# Patient Record
Sex: Female | Born: 1950 | Race: White | Hispanic: No | Marital: Married | Smoking: Never smoker
Health system: Southern US, Community
[De-identification: ages and names within clinical notes are randomized; demographics above are authoritative.]

## PROBLEM LIST (undated history)

## (undated) DIAGNOSIS — K219 Gastro-esophageal reflux disease without esophagitis: Secondary | ICD-10-CM

## (undated) DIAGNOSIS — T7840XA Allergy, unspecified, initial encounter: Secondary | ICD-10-CM

## (undated) DIAGNOSIS — M25569 Pain in unspecified knee: Secondary | ICD-10-CM

## (undated) DIAGNOSIS — M1990 Unspecified osteoarthritis, unspecified site: Secondary | ICD-10-CM

## (undated) DIAGNOSIS — B019 Varicella without complication: Secondary | ICD-10-CM

## (undated) DIAGNOSIS — E559 Vitamin D deficiency, unspecified: Secondary | ICD-10-CM

## (undated) DIAGNOSIS — J302 Other seasonal allergic rhinitis: Secondary | ICD-10-CM

## (undated) HISTORY — DX: Gastro-esophageal reflux disease without esophagitis: K21.9

## (undated) HISTORY — DX: Vitamin D deficiency, unspecified: E55.9

## (undated) HISTORY — PX: OTHER SURGICAL HISTORY: SHX169

## (undated) HISTORY — PX: UPPER GASTROINTESTINAL ENDOSCOPY: SHX188

## (undated) HISTORY — PX: POLYPECTOMY: SHX149

## (undated) HISTORY — PX: COLONOSCOPY: SHX174

## (undated) HISTORY — DX: Pain in unspecified knee: M25.569

## (undated) HISTORY — DX: Varicella without complication: B01.9

## (undated) HISTORY — DX: Allergy, unspecified, initial encounter: T78.40XA

## (undated) HISTORY — DX: Other seasonal allergic rhinitis: J30.2

## (undated) HISTORY — PX: KNEE SURGERY: SHX244

---

## 2000-07-15 ENCOUNTER — Other Ambulatory Visit: Admission: RE | Admit: 2000-07-15 | Discharge: 2000-07-15 | Payer: Self-pay | Admitting: Obstetrics and Gynecology

## 2000-12-28 ENCOUNTER — Encounter: Admission: RE | Admit: 2000-12-28 | Discharge: 2000-12-28 | Payer: Self-pay | Admitting: Obstetrics and Gynecology

## 2000-12-28 ENCOUNTER — Encounter: Payer: Self-pay | Admitting: Obstetrics and Gynecology

## 2001-09-01 ENCOUNTER — Other Ambulatory Visit: Admission: RE | Admit: 2001-09-01 | Discharge: 2001-09-01 | Payer: Self-pay | Admitting: Obstetrics and Gynecology

## 2002-02-03 ENCOUNTER — Encounter: Admission: RE | Admit: 2002-02-03 | Discharge: 2002-02-03 | Payer: Self-pay | Admitting: Obstetrics and Gynecology

## 2002-02-03 ENCOUNTER — Encounter: Payer: Self-pay | Admitting: Obstetrics and Gynecology

## 2003-02-22 ENCOUNTER — Encounter: Admission: RE | Admit: 2003-02-22 | Discharge: 2003-02-22 | Payer: Self-pay | Admitting: Obstetrics and Gynecology

## 2003-02-22 ENCOUNTER — Encounter: Payer: Self-pay | Admitting: Obstetrics and Gynecology

## 2003-04-27 ENCOUNTER — Other Ambulatory Visit: Admission: RE | Admit: 2003-04-27 | Discharge: 2003-04-27 | Payer: Self-pay | Admitting: Obstetrics and Gynecology

## 2004-05-03 ENCOUNTER — Other Ambulatory Visit: Admission: RE | Admit: 2004-05-03 | Discharge: 2004-05-03 | Payer: Self-pay | Admitting: Obstetrics and Gynecology

## 2004-09-19 ENCOUNTER — Ambulatory Visit (HOSPITAL_COMMUNITY): Admission: RE | Admit: 2004-09-19 | Discharge: 2004-09-19 | Payer: Self-pay | Admitting: Specialist

## 2005-05-07 ENCOUNTER — Other Ambulatory Visit: Admission: RE | Admit: 2005-05-07 | Discharge: 2005-05-07 | Payer: Self-pay | Admitting: Obstetrics and Gynecology

## 2007-12-16 HISTORY — PX: COLONOSCOPY: SHX174

## 2008-04-28 ENCOUNTER — Ambulatory Visit: Payer: Self-pay | Admitting: Internal Medicine

## 2008-05-19 ENCOUNTER — Ambulatory Visit: Payer: Self-pay | Admitting: Internal Medicine

## 2011-05-02 NOTE — Op Note (Signed)
Diane [name], Diane [name]                ACCOUNT NO.:  [id number]   MEDICAL RECORD NO.:  [id number]          PATIENT TYPE:  AMB   LOCATION:  DAY                          FACILITY:  [facility]   PHYSICIAN:  Jene Every, M.D.    DATE OF BIRTH:  [birth year]   DATE OF PROCEDURE:  09/19/2004  DATE OF DISCHARGE:                                 OPERATIVE REPORT   PREOPERATIVE DIAGNOSES:  Degenerative joint disease right knee.   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   PROCEDURE:  Right knee arthroscopy, chondroplasties of the patella, medial  and lateral femoral condyle, lateral tibial plateau, partial medial  meniscectomy, partial lateral meniscectomy.   ANESTHESIA:  General.   ASSISTANT:  None.   INDICATIONS FOR PROCEDURE:  This is a 60 year old female with refractory  knee pain and swelling despite conservative treatment including  corticosteroid injection, rest, activity modification, degenerative changes  seen on her MRI. She was thought to have degenerative changes, chondral flap  tears with possible meniscal tear.  Operative intervention is indicated for  diagnosis and treatment. The risks and benefits were discussed including  bleeding, infection, injury to vascular structures, no change in symptoms,  worsening symptoms and need for repeat debridement in the future, DVT, PE,  etc.   The patient was placed in supine position and after an adequate level of  general anesthesia and 1 g of Kefzol, the right lower extremity was prepped  and draped in the usual sterile fashion. A standard lateral parapatellar  portal was fashioned with a #11 blade as was the medial parapatellar portal.  Ingress cannula atraumatically placed and irrigant was utilized to  insufflate the joint. A camera inserted laterally and under direct  visualization a medial parapatellar portal was fashioned with a #11 blade  after localization with an 18 gauge needle sparing the medial meniscus.  Noted was extensive grade 3 changes of the patella and femoral sulcus. There  is no patellofemoral tracking of the medial compartment. There was extensive  grade 3 changes of the femoral condyle with chondral flap tears throughout  the weightbearing surface. There were extensive grade 3 changes of the  tibial plateau and a posterior horn of the medial meniscus tear.  A shaver  was introduced and utilized to perform a chondroplasty of the medial femoral  condyle and of the medial tibial plateau. A straight basket was used to  resect the radial tear of the medial meniscus to its stable base further  contoured by the Kuda shaver.  Following this, the remainder of the meniscus  was without evidence of tear and stable to propalpation.  The ACL was  unremarkable. The PCL was unremarkable. The shaver was then utilized to  perform a chondroplasty of the femoral sulcus and of the patella.  Next, I  examined the lateral compartment and it was fairly tight.  Using additional  relaxation and a small Kuda shaver due to extensive grade 3 changes and  fissuring of  the tibial plateau, the femoral condyle especially laterally  with large chondral flap tears and there was a tear along the lateral  meniscus in its posterior region. We introduced the shaver and utilized it  to perform a chondroplasty of the femoral condyle and tibial plateau and  then shaved the medial meniscus which was further dissected with a small  straight basket rongeur. The Kuda shaver was then reintroduced, a 3.2.  All  loose cartilaginous debris was removed. There were multiple pockets of near  grade 4 lesions of the femoral condyle and fissuring of the tibial plateau  as well. The remainder of the meniscus was stable to propalpation.  The PCL  was visualized and unremarkable.  Next, I  copiously lavaged the knee and  examined the gutters and they were unremarkable. All compartments were  without evidence of further loose cartilaginous debris and amendable to  arthroscopic intervention.  Under anesthesia, she had no anterior or  posterior drawer and no evidence of cruciate ligament insufficiency. Next,  all instrumentation was removed, the portals were closed with 4-0 nylon  simple suture, 0.25% Marcaine with epinephrine was infiltrated in the  joints, wound was dressed sterilely. She was awoken without difficulty and  transported to the recovery room in satisfactory condition.   The patient tolerated the procedure well with no complications.      JB/MEDQ  D:  09/19/2004  T:  09/19/2004  Job:  [id number]

## 2012-05-05 ENCOUNTER — Ambulatory Visit (INDEPENDENT_AMBULATORY_CARE_PROVIDER_SITE_OTHER): Payer: BC Managed Care – PPO | Admitting: Internal Medicine

## 2012-05-05 ENCOUNTER — Encounter: Payer: Self-pay | Admitting: Internal Medicine

## 2012-05-05 VITALS — BP 122/76 | HR 76 | Temp 97.7°F | Wt 149.0 lb

## 2012-05-05 DIAGNOSIS — J029 Acute pharyngitis, unspecified: Secondary | ICD-10-CM

## 2012-05-05 LAB — POCT RAPID STREP A (OFFICE): Rapid Strep A Screen: NEGATIVE

## 2012-05-05 MED ORDER — AZELASTINE HCL 0.1 % NA SOLN: 2.0000 | Freq: Two times a day (BID) | NASAL | Status: DC

## 2012-05-05 NOTE — Progress Notes (Signed)
  Subjective:    Patient ID: Diane [name], female    DOB: 02/09/1951, 61 y.o.   MRN: [id number]  HPI Patient 4 days history of sharp right ear pain, 3 days history of sore throat, it was worse yesterday, associated with some hoarseness. Some pain at the sides of the neck as well.   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 Some postnasal dripping. No actual nasal discharge anteriorly. Denies chest congestion or wheezing.    Objective:   Physical Exam General -- alert, well-developed. No apparent distress.  HEENT -- TMs normal, throat w/o redness, face symmetric and not tender to palpation, nose slt congested  Lungs -- normal respiratory effort, no intercostal retractions, no accessory muscle use, and normal breath sounds.   Heart-- normal rate, regular rhythm, no murmur, and no gallop.   Psych-- Cognition and judgment appear intact. Alert and cooperative with normal attention span and concentration.  not anxious appearing and not depressed appearing.       Assessment & Plan:   Pharyngitis, most likely viral. Strep test negative, see instructions

## 2012-05-05 NOTE — Patient Instructions (Signed)
Rest, fluids , tylenol If cough, take Mucinex DM twice a day as needed  For congestion use astelin nasal spray twice a day until you feel better Call if no better in few days Call anytime if the symptoms are severe

## 2012-05-06 ENCOUNTER — Encounter: Payer: Self-pay | Admitting: Internal Medicine

## 2013-08-22 ENCOUNTER — Telehealth: Payer: Self-pay | Admitting: Internal Medicine

## 2013-08-22 NOTE — Telephone Encounter (Signed)
I don't see a problem unless one of the children is immunosuppressed (cancer treatment, prednisone use etc.). We have not seen her for a routine checkup, I assume her gynecologist is the  recommending the shingles shot correct?

## 2013-08-22 NOTE — Telephone Encounter (Signed)
Patient states that she was told a few months ago that she needed to have the shingles vaccine. Before she comes in for this she would like to know if there is an issue with being around small children after receiving the vaccine? Please advise.

## 2013-08-23 ENCOUNTER — Telehealth: Payer: Self-pay | Admitting: *Deleted

## 2013-08-23 NOTE — Telephone Encounter (Signed)
Pt notified via telephone. Pt states was suppose to get it last year but was told to wait next year till her husband was done with chemo therapy.Pt states doesn't see her GYN till next month. DJR

## 2013-08-24 NOTE — Telephone Encounter (Signed)
erorr

## 2013-08-24 NOTE — Telephone Encounter (Signed)
If her husband is done witho chemotherapy and doing well, I don't see any contraindication. If there is any doubt, recommend to talk with her husband's oncologist

## 2013-12-26 ENCOUNTER — Ambulatory Visit (INDEPENDENT_AMBULATORY_CARE_PROVIDER_SITE_OTHER): Payer: BC Managed Care – PPO | Admitting: Physician Assistant

## 2013-12-26 ENCOUNTER — Encounter: Payer: Self-pay | Admitting: Physician Assistant

## 2013-12-26 VITALS — BP 102/70 | HR 83 | Temp 98.2°F | Resp 16 | Ht 66.0 in | Wt 156.8 lb

## 2013-12-26 DIAGNOSIS — R509 Fever, unspecified: Secondary | ICD-10-CM

## 2013-12-26 DIAGNOSIS — J3489 Other specified disorders of nose and nasal sinuses: Secondary | ICD-10-CM

## 2013-12-26 DIAGNOSIS — J069 Acute upper respiratory infection, unspecified: Secondary | ICD-10-CM

## 2013-12-26 DIAGNOSIS — R0989 Other specified symptoms and signs involving the circulatory and respiratory systems: Secondary | ICD-10-CM

## 2013-12-26 DIAGNOSIS — R0981 Nasal congestion: Secondary | ICD-10-CM

## 2013-12-26 DIAGNOSIS — R059 Cough, unspecified: Secondary | ICD-10-CM

## 2013-12-26 DIAGNOSIS — R05 Cough: Secondary | ICD-10-CM

## 2013-12-26 LAB — POCT INFLUENZA A/B
Influenza A, POC: NEGATIVE
Influenza B, POC: NEGATIVE

## 2013-12-26 MED ORDER — FLUTICASONE PROPIONATE 50 MCG/ACT NA SUSP: 2.0000 | Freq: Every day | NASAL | Status: AC

## 2013-12-26 NOTE — Patient Instructions (Signed)
Increase fluid intake.  Saline nasal spray and Flonase as directed.  Mucinex D for congestion.  Delsym for cough.  Take a multivitamin.  Place a humidifier in the bedroom.  Please call or return to clinic if symptoms aren't improving within 48 hour.

## 2013-12-26 NOTE — Progress Notes (Signed)
Patient presents to clinic today c/o 3 days of dry cough, nasal congestion and chest congestion.  Patient denies fever, chills, muscle aches, malaise or fatigue.  Denies shortness of breath, wheeze or pleuritic pain.  Patient does endorse some mild chest tightness at nighttime.  Patient denies history of allergy or asthma.  Patient denies recent travel.  Has been around relative with similar symptoms.  Past Medical History  Diagnosis Date  . Chicken pox     Current Outpatient Prescriptions on File Prior to Visit  Medication Sig Dispense Refill  . azelastine (ASTELIN) 137 MCG/SPRAY nasal spray Place 2 sprays into the nose 2 (two) times daily. Use in each nostril as directed  30 mL  1   No current facility-administered medications on file prior to visit.    No Known Allergies  Family History  Problem Relation Age of Onset  . Heart disease Mother   . Heart disease Father     History   Social History  . Marital Status: Married    Spouse Name: N/A    Number of Children: N/A  . Years of Education: N/A   Social History Main Topics  . Smoking status: Never Smoker   . Smokeless tobacco: None  . Alcohol Use: None  . Drug Use: None  . Sexual Activity: None   Other Topics Concern  . None   Social History Narrative  . None    Review of Systems - See HPI.  All other ROS are negative.  Filed Vitals:   12/26/13 1013  BP: 102/70  Pulse: 83  Temp: 98.2 F (36.8 C)  Resp: 16   Physical Exam  Vitals reviewed. Constitutional: She is oriented to person, place, and time and well-developed, well-nourished, and in no distress.  HENT:  Head: Normocephalic and atraumatic.  Right Ear: External ear normal.  Left Ear: External ear normal.  Nose: Nose normal.  Mouth/Throat: Oropharynx is clear and moist. No oropharyngeal exudate.  TM within normal limits bilaterally.  Eyes: Conjunctivae are normal. Pupils are equal, round, and reactive to light.  Neck: Neck supple.  Cardiovascular:  Normal rate, regular rhythm, normal heart sounds and intact distal pulses.   Pulmonary/Chest: Effort normal and breath sounds normal. No respiratory distress. She has no wheezes. She has no rales. She exhibits no tenderness.  Lymphadenopathy:    She has no cervical adenopathy.  Neurological: She is alert and oriented to person, place, and time. No cranial nerve deficit.  Skin: Skin is warm and dry. No rash noted.  Psychiatric: Affect normal.   No results found for this or any previous visit (from the past 2160 hour(s)).  Assessment/Plan: No problem-specific assessment & plan notes found for this encounter.

## 2013-12-26 NOTE — Progress Notes (Signed)
Pre visit review using our clinic review tool, if applicable. No additional management support is needed unless otherwise documented below in the visit note/SLS  

## 2013-12-27 NOTE — Assessment & Plan Note (Signed)
Flu swab Negative. Increase fluid intake. Rest. Saline nasal spray. Probiotic. Plain Mucinex. Delsym for cough. Tylenol for throat pain. Humidifier in bedroom. Please call or return to clinic if symptoms not improving.

## 2015-03-30 ENCOUNTER — Encounter: Payer: Self-pay | Admitting: Internal Medicine

## 2015-05-21 ENCOUNTER — Ambulatory Visit (INDEPENDENT_AMBULATORY_CARE_PROVIDER_SITE_OTHER): Payer: BC Managed Care – PPO | Admitting: Family Medicine

## 2015-05-21 ENCOUNTER — Other Ambulatory Visit (INDEPENDENT_AMBULATORY_CARE_PROVIDER_SITE_OTHER): Payer: BC Managed Care – PPO

## 2015-05-21 ENCOUNTER — Encounter: Payer: Self-pay | Admitting: Family Medicine

## 2015-05-21 VITALS — BP 110/78 | HR 73 | Ht 66.0 in | Wt 153.0 lb

## 2015-05-21 DIAGNOSIS — R071 Chest pain on breathing: Secondary | ICD-10-CM

## 2015-05-21 DIAGNOSIS — R0789 Other chest pain: Secondary | ICD-10-CM | POA: Diagnosis not present

## 2015-05-21 MED ORDER — PREDNISONE 50 MG PO TABS: 50.0000 mg | ORAL_TABLET | Freq: Every day | ORAL | Status: DC

## 2015-05-21 NOTE — Patient Instructions (Signed)
Good to meet you Prednisone daily for 5 days Ice 20 minutes 2 times daily. Usually after activity and before bed. pennsaid pinkie amount topically 2 times daily as needed.  Vitamin D 4000 IU daily for 2 weeks then 2000 IU daily  Avoid lifting more than 10 # Check in with me through lindsay in 2 weeks and tell me how you are doing.

## 2015-05-21 NOTE — Assessment & Plan Note (Signed)
Discussed with patient in great length. Patient is going to try prednisone to see thickened decreasing the inflammation in the surrounding area. I do not see any bony abnormality and I did not feel that x-rays were giving [place] any more information. Patient denies any fevers, chills, or any abnormal weight loss. I do not feel that further workup is necessary at this time. Patient given topical anti-inflammatory syndrome discussed which activities to avoid. Patient and will follow-up in 3 weeks for further evaluation and treatment.

## 2015-05-21 NOTE — Progress Notes (Signed)
[name] [address] [place], [place] [address] Phone: [phone] Subjective:    CC: chest pain  BWL:[id number] Diane [name] is a 64 y.o. female coming in with complaint of chest pain. 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 The numbness any significant discomfort at that time. 2 days after this patient was playing [employer] though at church and fell and caught herself with her right arm. Patient had significant amount of pain and some mild swelling in that area right afterwards. Since then she continues to have the discomfort. States that even with deep inspiration she can have pain. Patient denies any radiation down the arm, towards the shoulder, or to her neck. Patient though has noticed that she's been much more careful and not doing as much activity. Patient states even the last 2 days she has had trouble even sleeping secondary to the pain. Rates the severity of 7 out of 10. Has tried some ibuprofen with minimal benefit.  Past Medical History  Diagnosis Date  . Chicken pox    Past Surgical History  Procedure Laterality Date  . Knee surgery  1995, 2005   History  Substance Use Topics  . Smoking status: Never Smoker   . Smokeless tobacco: Not on file  . Alcohol Use: Not on file   No Known Allergies Family History  Problem Relation Age of Onset  . Heart disease Mother   . Heart disease Father         Past medical history, social, surgical and family history all reviewed in electronic medical record.   Review of Systems: No headache, visual changes, nausea, vomiting, diarrhea, constipation, dizziness, abdominal pain, skin rash, fevers, chills, night sweats, weight loss, swollen lymph nodes, body aches, joint swelling, muscle aches, chest pain, shortness of breath, mood changes.   Objective Blood pressure 110/78, pulse 73, height 5\' 6"  (1.676 m), weight 153 lb (69.4 kg), SpO2 96 %.  General: No  apparent distress alert and oriented x3 mood and affect normal, dressed appropriately.  HEENT: Pupils equal, extraocular movements intact  Respiratory: Patient's speak in full sentences and does not appear short of breath  Cardiovascular: No lower extremity edema, non tender, no erythema  Skin: Warm dry intact with no signs of infection or rash on extremities or on axial skeleton.  Abdomen: Soft nontender  Neuro: Cranial nerves II through XII are intact, neurovascularly intact in all extremities with 2+ DTRs and 2+ pulses.  Lymph: No lymphadenopathy of posterior or anterior cervical chain or axillae bilaterally.  Gait normal with good balance and coordination.  MSK:  Non tender with full range of motion and good stability and symmetric strength and tone of shoulders, elbows, wrist, hip, knee and ankles bilaterally.  Just exam shows the patient does have some mild inflammation appears on the right side of the chest wall between ribs #4 through 6. This is tender to palpation. No significant swelling over the  joint. Patient has full range of motion of the shoulder and neck without any discomfort. Mild increase in discomfort with deep inspiration.  Neck: Inspection unremarkable. No palpable stepoffs. Negative Spurling's maneuver. Full neck range of motion Grip strength and sensation normal in bilateral hands Strength good C4 to T1 distribution No sensory change to C4 to T1 Negative Hoffman sign bilaterally Reflexes normal  Limited muscular skeletal ultrasound performed and interpreted by Hulan Saas, M  Limited ultrasound of the chest wall shows the  patient does have some mild hypoechoic changes and what appears to be a cartilaginous injury of the costochondral junction on ribs fourth and fifth. Hypoechoic changes and increasing Doppler flow noted. No avulsion fracture noted. Impression: Cartilage injury of the costochondral junction   Impression and Recommendations:     This case  required medical decision making of moderate complexity.

## 2015-05-21 NOTE — Progress Notes (Signed)
Pre visit review using our clinic review tool, if applicable. No additional management support is needed unless otherwise documented below in the visit note. 

## 2015-10-24 LAB — HM PAP SMEAR

## 2015-10-24 LAB — HM MAMMOGRAPHY

## 2015-10-26 ENCOUNTER — Other Ambulatory Visit: Payer: Self-pay | Admitting: Obstetrics and Gynecology

## 2015-10-26 DIAGNOSIS — N959 Unspecified menopausal and perimenopausal disorder: Secondary | ICD-10-CM

## 2015-10-29 ENCOUNTER — Encounter: Payer: Self-pay | Admitting: Internal Medicine

## 2015-11-02 ENCOUNTER — Telehealth: Payer: Self-pay | Admitting: Internal Medicine

## 2015-11-02 NOTE — Telephone Encounter (Signed)
Noted  

## 2015-11-02 NOTE — Telephone Encounter (Signed)
Relation to [id number] Call back [phone] Pharmacy:  Reason for call:  Patient received letter in the mail advising her to schedule follow up appointment, patient states OBGYN conducts physicals and "Thank God, shes been healthy". Patient states if she needs to schedule a check up she will but she feels fine.

## 2015-12-05 ENCOUNTER — Ambulatory Visit
Admission: RE | Admit: 2015-12-05 | Discharge: 2015-12-05 | Disposition: A | Discharge status: Home / Self Care | Payer: BC Managed Care – PPO | Source: Ambulatory Visit | Attending: Obstetrics and Gynecology | Admitting: Obstetrics and Gynecology

## 2015-12-05 DIAGNOSIS — N959 Unspecified menopausal and perimenopausal disorder: Secondary | ICD-10-CM

## 2016-03-07 ENCOUNTER — Encounter: Payer: Self-pay | Admitting: Internal Medicine

## 2016-10-08 ENCOUNTER — Encounter: Payer: Self-pay | Admitting: Family Medicine

## 2016-10-08 ENCOUNTER — Ambulatory Visit (INDEPENDENT_AMBULATORY_CARE_PROVIDER_SITE_OTHER): Payer: BC Managed Care – PPO | Admitting: Family Medicine

## 2016-10-08 VITALS — BP 122/87 | HR 77 | Temp 97.9°F | Ht 66.0 in | Wt 155.8 lb

## 2016-10-08 DIAGNOSIS — R1013 Epigastric pain: Secondary | ICD-10-CM | POA: Diagnosis not present

## 2016-10-08 DIAGNOSIS — Z23 Encounter for immunization: Secondary | ICD-10-CM | POA: Diagnosis not present

## 2016-10-08 DIAGNOSIS — R1310 Dysphagia, unspecified: Secondary | ICD-10-CM | POA: Diagnosis not present

## 2016-10-08 LAB — TROPONIN I: TNIDX: 0 ug/L (ref 0.00–0.06)

## 2016-10-08 MED ORDER — GI COCKTAIL ~~LOC~~: 30.0000 mL | Freq: Once | ORAL | Status: DC

## 2016-10-08 MED ORDER — OMEPRAZOLE 20 MG PO CPDR: 20.0000 mg | DELAYED_RELEASE_CAPSULE | Freq: Every day | ORAL | 3 refills | Status: DC

## 2016-10-08 MED ORDER — SUCRALFATE 1 G PO TABS: 1.0000 g | ORAL_TABLET | Freq: Three times a day (TID) | ORAL | 0 refills | Status: DC

## 2016-10-08 NOTE — Progress Notes (Addendum)
[facility] [address], [place], [place] [address] [phone]  Date:  10/08/2016   Name:  IDELLA [name]   DOB:  10-18-1951   MRN:  [id number]  PCP:  Kathlene November, MD    Chief Complaint: Abdominal Pain (c/o upper abd pain that comes and goes x 3 weeks. Pain is some times sharp, didn't feel sharp yesterday but pt states that she did feel bloated.  Pt states that certain foods are hard to swallow.  Would like flu vaccine today. )   History of Present Illness:  Diane [name] is a 65 y.o. very pleasant female patient who presents with the following:  She is here today with an abd concern that she has noted since the first of this month.   She awoke one day with a sharp pain under her breastbone/ epigastric area. She had diarrhea that same day, but did not have a fever or any belly pains. Sx went away for a week, then came back. It has returned twice more over the last couple of weeks following much of the same pattern.  She feels like she is having heartburn and is concerned about an ulcer She last had the sx yesterday- they are better now but not totally resolved  She did not eat much yesterday due to feat of making herself worse.  She also notes that certain foods can be hard to swallow- especially tough meats, she has to chew carefully.  She has had to cough up pieces of food in the past.  Liquids do not get stuck. Drinking liquids may not wash down something that is stuck in her throat.   She is generally in very good health.    The pain is slightly present today.    No CP or SOB< no heart palpitations.  She has had more burping and also acid reflux in her throat here recently.    No vomiting Appetite is ok but she finds herself eating less due to the pains, she may feel boated.  No cough or fever.  No chills.     Patient Active Problem List   Diagnosis Date Noted  . Costochondral chest pain 05/21/2015  . Viral  URI 12/27/2013    Past Medical History:  Diagnosis Date  . Chicken pox     Past Surgical History:  Procedure Laterality Date  . [place], 2005    Social History  Substance Use Topics  . Smoking status: Never Smoker  . Smokeless tobacco: Not on file  . Alcohol use Not on file    Family History  Problem Relation Age of Onset  . Heart disease Mother   . Heart disease Father     No Known Allergies  Medication list has been reviewed and updated.  Current Outpatient Prescriptions on File Prior to Visit  Medication Sig Dispense Refill  . fluticasone (FLONASE) 50 MCG/ACT nasal spray Place 2 sprays into both nostrils daily. (Patient taking differently: Place 2 sprays into both nostrils daily as needed. ) 16 g 6  . Multiple Vitamin (MULTIVITAMIN) tablet Take 1 tablet by mouth daily.     No current facility-administered medications on file prior to visit.     Review of Systems:  As per HPI- otherwise negative.  She does not do much formal exercise but notes no CP with carrying groceries or other activities day to day No history of abd operation, no hemoptysis  Physical Examination: Vitals:   10/08/16 1453  BP: 122/87  Pulse: 77  Temp: 97.9 F (36.6 C)   Vitals:   10/08/16 1453  Weight: 155 lb 12.8 oz (70.7 kg)  Height: 5\' 6"  (1.676 m)   Body mass index is 25.15 kg/m. Ideal Body Weight: Weight in (lb) to have BMI = 25: 154.6  GEN: WDWN, NAD, Non-toxic, A & O x 3, looks well and fit/ healthy for age 69: Atraumatic, Normocephalic. Neck supple. No masses, No LAD. Ears and Nose: No external deformity. CV: RRR, No M/G/R. No JVD. No thrill. No extra heart sounds. PULM: CTA B, no wheezes, crackles, rhonchi. No retractions. No resp. distress. No accessory muscle use. ABD: S,  ND, +BS. No rebound. No HSM. EXTR: No c/c/e NEURO Normal gait.  PSYCH: Normally interactive. Conversant. Not depressed or anxious appearing.  Calm demeanor.  epigastic tenderness  to palpation- pt confirms that this is the pain she has noticed  Negative Murphy's sign  Given GI cocktail-   This did improve her sx quite a bit  EKG:  Normal sinus with RSR' in V1 only  Assessment and Plan: Abdominal pain, epigastric - Plan: CBC, Comprehensive metabolic panel, H. pylori breath test, Lipase, gi cocktail (Maalox,Lidocaine,Donnatal), EKG 12-Lead, sucralfate (CARAFATE) 1 g tablet, omeprazole (PRILOSEC) 20 MG capsule, Troponin I  Dysphagia, unspecified type - Plan: Ambulatory referral to Gastroenterology  Immunization due  Here today with epigastric pain, intermittent and improved with GI cocktail. Also sx of a possible esophageal stricture.  Will start carafate and PPI, refer to GI, run labs as above Flu shot today  We are going to do an H pylori test to look for possible ulcer causing bacteria. In the meantime we will have you start on carafate and an acid reducer (PPI) medication. I also think we should have you see GI as you may have an esophageal stricture causing food to get stuck in your throat.  In the meantime be sure to chew your food well and avoid eating anything very tough and hard to chew.    We will also check labs today to ensure that your liver, pancreas and kidneys are working normally and to provide further reassurance that your heart is ok I will be in touch with your labs and we will arrange a GI referral for you. Let me know if your symptoms are not improving with treatment!   Signed Lamar Blinks, MD  Called on 10/26 with labs  Results for orders placed or performed in visit on 10/08/16  CBC  Result Value Ref Range   WBC 7.6 4.0 - 10.5 K/uL   RBC 4.73 3.87 - 5.11 Mil/uL   Platelets 232.0 150.0 - 400.0 K/uL   Hemoglobin 14.7 12.0 - 15.0 g/dL   HCT 43.8 36.0 - 46.0 %   MCV 92.5 78.0 - 100.0 fl   MCHC 33.7 30.0 - 36.0 g/dL   RDW 13.0 11.5 - 15.5 %  Comprehensive metabolic panel  Result Value Ref Range   Sodium 140 135 - 145 mEq/L    Potassium 3.6 3.5 - 5.1 mEq/L   Chloride 104 96 - 112 mEq/L   CO2 28 19 - 32 mEq/L   Glucose, Bld 89 70 - 99 mg/dL   BUN 14 6 - 23 mg/dL   Creatinine, Ser 0.74 0.40 - 1.20 mg/dL   Total Bilirubin 0.5 0.2 - 1.2 mg/dL   Alkaline Phosphatase 75 39 - 117 U/L   AST 22 0 - 37 U/L  ALT 15 0 - 35 U/L   Total Protein 7.4 6.0 - 8.3 g/dL   Albumin 4.4 3.5 - 5.2 g/dL   Calcium 10.1 8.4 - 10.5 mg/dL   GFR 83.97 >60.00 mL/min  H. pylori breath test  Result Value Ref Range   H. pylori Breath Test NOT DETECTED Not Detected  Lipase  Result Value Ref Range   Lipase 12.0 11.0 - 59.0 U/L  Troponin I  Result Value Ref Range   TNIDX 0.00 0.00 - 0.06 ug/l   Called to check on her- she is feeling "fine today," is using her medication and eating more slowly.  She does have a GI appt but not until January.  She will let me know if her sx return in the meantime and if so I will try to move up her appt

## 2016-10-08 NOTE — Progress Notes (Signed)
Pre visit review using our clinic review tool, if applicable. No additional management support is needed unless otherwise documented below in the visit note. 

## 2016-10-08 NOTE — Patient Instructions (Addendum)
We are going to do an H pylori test to look for possible ulcer causing bacteria. In the meantime we will have you start on carafate and an acid reducer (PPI) medication. I also think we should have you see GI as you may have an esophageal stricture causing food to get stuck in your throat.  In the meantime be sure to chew your food well and avoid eating anything very tough and hard to chew.    We will also check labs today to ensure that your liver, pancreas and kidneys are working normally and to provide further reassurance that your heart is ok I will be in touch with your labs and we will arrange a GI referral for you. Let me know if your symptoms are not improving with treatment!

## 2016-10-09 ENCOUNTER — Encounter: Payer: Self-pay | Admitting: Gastroenterology

## 2016-10-09 ENCOUNTER — Encounter: Payer: Self-pay | Admitting: Family Medicine

## 2016-10-09 LAB — CBC
HEMATOCRIT: 43.8 % (ref 36.0–46.0)
HEMOGLOBIN: 14.7 g/dL (ref 12.0–15.0)
MCHC: 33.7 g/dL (ref 30.0–36.0)
MCV: 92.5 fl (ref 78.0–100.0)
PLATELETS: 232 10*3/uL (ref 150.0–400.0)
RBC: 4.73 Mil/uL (ref 3.87–5.11)
RDW: 13 % (ref 11.5–15.5)
WBC: 7.6 10*3/uL (ref 4.0–10.5)

## 2016-10-09 LAB — LIPASE: LIPASE: 12 U/L (ref 11.0–59.0)

## 2016-10-09 LAB — COMPREHENSIVE METABOLIC PANEL
ALBUMIN: 4.4 g/dL (ref 3.5–5.2)
ALK PHOS: 75 U/L (ref 39–117)
ALT: 15 U/L (ref 0–35)
AST: 22 U/L (ref 0–37)
BUN: 14 mg/dL (ref 6–23)
CALCIUM: 10.1 mg/dL (ref 8.4–10.5)
CHLORIDE: 104 meq/L (ref 96–112)
CO2: 28 mEq/L (ref 19–32)
CREATININE: 0.74 mg/dL (ref 0.40–1.20)
GFR: 83.97 mL/min (ref >60.00)
Glucose, Bld: 89 mg/dL (ref 70–99)
POTASSIUM: 3.6 meq/L (ref 3.5–5.1)
SODIUM: 140 meq/L (ref 135–145)
TOTAL PROTEIN: 7.4 g/dL (ref 6.0–8.3)
Total Bilirubin: 0.5 mg/dL (ref 0.2–1.2)

## 2016-10-09 LAB — H. PYLORI BREATH TEST: H. PYLORI BREATH TEST: NOT DETECTED

## 2016-11-03 LAB — HM MAMMOGRAPHY

## 2016-11-03 LAB — HM PAP SMEAR

## 2016-11-05 ENCOUNTER — Encounter: Payer: Self-pay | Admitting: Internal Medicine

## 2016-11-28 ENCOUNTER — Other Ambulatory Visit: Payer: Self-pay | Admitting: *Deleted

## 2016-11-28 DIAGNOSIS — M25562 Pain in left knee: Principal | ICD-10-CM

## 2016-11-28 DIAGNOSIS — M25561 Pain in right knee: Principal | ICD-10-CM

## 2016-11-28 DIAGNOSIS — G8929 Other chronic pain: Secondary | ICD-10-CM

## 2016-12-01 ENCOUNTER — Ambulatory Visit (INDEPENDENT_AMBULATORY_CARE_PROVIDER_SITE_OTHER): Payer: BC Managed Care – PPO | Admitting: Family Medicine

## 2016-12-01 ENCOUNTER — Encounter: Payer: Self-pay | Admitting: Family Medicine

## 2016-12-01 ENCOUNTER — Ambulatory Visit (INDEPENDENT_AMBULATORY_CARE_PROVIDER_SITE_OTHER)
Admission: RE | Admit: 2016-12-01 | Discharge: 2016-12-01 | Disposition: A | Discharge status: Home / Self Care | Payer: BC Managed Care – PPO | Source: Ambulatory Visit | Attending: Family Medicine | Admitting: Family Medicine

## 2016-12-01 DIAGNOSIS — M25561 Pain in right knee: Secondary | ICD-10-CM

## 2016-12-01 DIAGNOSIS — M25562 Pain in left knee: Secondary | ICD-10-CM | POA: Diagnosis not present

## 2016-12-01 DIAGNOSIS — M170 Bilateral primary osteoarthritis of knee: Secondary | ICD-10-CM

## 2016-12-01 DIAGNOSIS — G8929 Other chronic pain: Secondary | ICD-10-CM | POA: Diagnosis not present

## 2016-12-01 MED ORDER — DICLOFENAC SODIUM 2 % TD SOLN: 2.0000 "application " | Freq: Two times a day (BID) | TRANSDERMAL | 3 refills | Status: DC

## 2016-12-01 MED ORDER — VITAMIN D (ERGOCALCIFEROL) 1.25 MG (50000 UNIT) PO CAPS: 50000.0000 [IU] | ORAL_CAPSULE | ORAL | 0 refills | Status: DC

## 2016-12-01 NOTE — Patient Instructions (Signed)
Good to see you.  Ice 20 minutes 2 times daily. Usually after activity and before bed. Exercises 3 times a week.  pennsaid pinkie amount topically 2 times daily as needed.  Once weekly vitamin D for next 12 weeks.  Consider the bike more then the treadmill  Injections today  See me again in 3-4 weeks and see how you are doing and if we need the other injections

## 2016-12-01 NOTE — Assessment & Plan Note (Signed)
Bilateral injections given today. Patient work with Product/process development scientist to learn home exercises in greater detail. We discussed icing regimen. Once weekly vitamin D given for the mild osteopenia noted on x-ray. Patient will continue to be active otherwise. Patient will come back and see me again in 4 weeks. Worsening symptoms she could be a candidate for viscous supple mentation.

## 2016-12-01 NOTE — Progress Notes (Signed)
[name] [address] [place], [place] [address] Phone: [phone] Subjective:    I'm seeing this patient by the request  of:  Kathlene November, MD   CC: Bilateral knee pain  [id number]  Diane [name] is a 65 y.o. female coming in with complaint of bilateral knee pain. Has had 2 surgeries on the right knee last one being in 2005. Vision states that unfortunately most the pain seems to be on the medial aspect of both knees. Worse with a lot of activity. Patient states that putting pressure on her knees is severely painful. Right knees get swelling of the time. Patient rates the severity pain is 8 out of 10 and is stopping her from some daily activities at this time. Sometimes very uncomfortable at night. Does respond fairly well to anti-inflammatories. Has not given out on her but does not feel as solid as it used to.      Past Medical History:  Diagnosis Date  . Chicken pox    Past Surgical History:  Procedure Laterality Date  . [place], 2005   Social History   Social History  . Marital status: Married    Spouse name: N/A  . Number of children: N/A  . Years of education: N/A   Social History Main Topics  . Smoking status: Never Smoker  . Smokeless tobacco: None  . Alcohol use None  . Drug use: Unknown  . Sexual activity: Not Asked   Other Topics Concern  . None   Social History Narrative  . None   No Known Allergies Family History  Problem Relation Age of Onset  . Heart disease Mother   . Heart disease Father     Past medical history, social, surgical and family history all reviewed in electronic medical record.  No pertanent information unless stated regarding to the chief complaint.   Review of Systems:Review of systems updated and as accurate as of 12/01/16  No headache, visual changes, nausea, vomiting, diarrhea, constipation, dizziness, abdominal pain, skin rash, fevers, chills, night sweats, weight loss, swollen  lymph nodes,  chest pain, shortness of breath, mood changes.   Objective  Blood pressure 118/80, pulse 67, height 5\' 6"  (1.676 m), SpO2 98 %. Systems examined below as of 12/01/16   General: No apparent distress alert and oriented x3 mood and affect normal, dressed appropriately.  HEENT: Pupils equal, extraocular movements intact  Respiratory: Patient's speak in full sentences and does not appear short of breath  Cardiovascular: No lower extremity edema, non tender, no erythema  Skin: Warm dry intact with no signs of infection or rash on extremities or on axial skeleton.  Abdomen: Soft nontender  Neuro: Cranial nerves II through XII are intact, neurovascularly intact in all extremities with 2+ DTRs and 2+ pulses.  Lymph: No lymphadenopathy of posterior or anterior cervical chain or axillae bilaterally.  Gait normal with good balance and coordination.  MSK:  Non tender with full range of motion and good stability and symmetric strength and tone of shoulders, elbows, wrist, hip, and ankles bilaterally.  Knee: Bilateral Mild valgus deformity noted of the knees bilaterally. Mild tenderness to palpation over medial and lateral joint line ROM full in flexion and extension and lower leg rotation. Mild instability noted with valgus force bilaterally Negative Mcmurray's, Apley's, and Thessalonian tests. painful patellar compression. Patellar glide with mild crepitus. Patellar and quadriceps tendons unremarkable. Hamstring and quadriceps strength is normal.   After informed written and verbal  consent, patient was seated on exam table. Right knee was prepped with alcohol swab and utilizing anterolateral approach, patient's right knee space was injected with 4:1  marcaine 0.5%: Kenalog 40mg /dL. Patient tolerated the procedure well without immediate complications.  After informed written and verbal consent, patient was seated on exam table. Left knee was prepped with alcohol swab and utilizing  anterolateral approach, patient's left knee space was injected with 4:1  marcaine 0.5%: Kenalog 40mg /dL. Patient tolerated the procedure well without immediate complications.   Impression and Recommendations:     This case required medical decision making of moderate complexity.      Note: This dictation was prepared with Dragon dictation along with smaller phrase technology. Any transcriptional errors that result from this process are unintentional.

## 2016-12-02 ENCOUNTER — Ambulatory Visit: Payer: BC Managed Care – PPO | Admitting: Family Medicine

## 2016-12-17 ENCOUNTER — Ambulatory Visit (INDEPENDENT_AMBULATORY_CARE_PROVIDER_SITE_OTHER): Payer: BC Managed Care – PPO | Admitting: Gastroenterology

## 2016-12-17 ENCOUNTER — Encounter: Payer: Self-pay | Admitting: Gastroenterology

## 2016-12-17 VITALS — BP 110/78 | HR 80 | Ht 67.0 in | Wt 160.1 lb

## 2016-12-17 DIAGNOSIS — R1310 Dysphagia, unspecified: Secondary | ICD-10-CM | POA: Diagnosis not present

## 2016-12-17 DIAGNOSIS — Z1211 Encounter for screening for malignant neoplasm of colon: Secondary | ICD-10-CM

## 2016-12-17 DIAGNOSIS — K219 Gastro-esophageal reflux disease without esophagitis: Secondary | ICD-10-CM

## 2016-12-17 DIAGNOSIS — K5900 Constipation, unspecified: Secondary | ICD-10-CM | POA: Diagnosis not present

## 2016-12-17 MED ORDER — RANITIDINE HCL 150 MG PO TABS: 150.0000 mg | ORAL_TABLET | Freq: Two times a day (BID) | ORAL | 3 refills | Status: DC

## 2016-12-17 MED ORDER — NA SULFATE-K SULFATE-MG SULF 17.5-3.13-1.6 GM/177ML PO SOLN: 1.0000 | Freq: Once | ORAL | 0 refills | Status: AC

## 2016-12-17 NOTE — Progress Notes (Signed)
HPI :  66 y/o female here for new patient evaluation for dysphagia. Previously seen by Dr. Olevia Perches, last seen in 2009. Here for symptoms of GERD, dysphagia, and constipation.  Patient has had some dysphagia and lower chest discomfort. She has had intermittent symptoms of dysphagia - usually occurs with meats. She feels it in the sternal notch. It is less than once per month, previously was more frequent. She has tried to eat slower and chew more frequently to minimize symptoms. She thinks symptoms ongoing for years. She has some occasional heartburn for which she was taking TUMS. She had been on omeprazole 20mg  / day for this but she states she doesn't like to take medications for this. When she took the omeprazole every day her symptoms resolved. She thinks eating later at night makes symptoms worse. No FH of esophageal cancer, father has had dilations in the past. No prior endoscopy.   She reports some occasional constipation. She is having a bowel movement usually once every day, sometimes less frequently. Occasional straining. She can have rare blood in the stools with straining, mostly on toilet paper with wiping. No colon cancer in the family. She does not take anything for her bowels. She has been on miralax in the past which appeared to help.    Last colonoscopy 05/19/2008 - normal exam but suboptimal prep   Past Medical History:  Diagnosis Date  . Chicken pox   . GERD (gastroesophageal reflux disease)   . Knee pain   . Seasonal allergic rhinitis   . Vitamin D deficiency      Past Surgical History:  Procedure Laterality Date  . COLONOSCOPY  2009  . KNEE SURGERY  1995, 2005   Family History  Problem Relation Age of Onset  . Heart disease Mother   . Heart disease Father   . Colon cancer Neg Hx   . Stomach cancer Neg Hx    Social History  Substance Use Topics  . Smoking status: Never Smoker  . Smokeless tobacco: Never Used  . Alcohol use No   Current Outpatient  Prescriptions  Medication Sig Dispense Refill  . Ascorbic Acid (VITAMIN C GUMMIES PO) Take 1 tablet by mouth daily.    . Biotin 1000 MCG tablet Take 1,000 mcg by mouth daily.    . Diclofenac Sodium (PENNSAID) 2 % SOLN Place 2 application onto the skin 2 (two) times daily. 112 g 3  . fluticasone (FLONASE) 50 MCG/ACT nasal spray Place 2 sprays into both nostrils daily. (Patient taking differently: Place 2 sprays into both nostrils daily as needed. ) 16 g 6  . glucosamine-chondroitin 500-400 MG tablet Take 1 tablet by mouth daily.    . Multiple Vitamin (MULTIVITAMIN) tablet Take 1 tablet by mouth daily.    Marland Kitchen omeprazole (PRILOSEC) 20 MG capsule Take 1 capsule (20 mg total) by mouth daily. 30 capsule 3  . Vitamin D, Ergocalciferol, (DRISDOL) 50000 units CAPS capsule Take 1 capsule (50,000 Units total) by mouth every 7 (seven) days. 12 capsule 0   Current Facility-Administered Medications  Medication Dose Route Frequency Provider Last Rate Last Dose  . gi cocktail (Maalox,Lidocaine,Donnatal)  30 mL Oral Once Gay Filler Copland, MD       No Known Allergies   Review of Systems: All systems reviewed and negative except where noted in HPI.    Dg Knee Bilateral Standing Ap  Result Date: 12/01/2016 CLINICAL DATA:  Bilateral knee pain. EXAM: BILATERAL KNEES STANDING - 1 VIEW COMPARISON:  No recent.  FINDINGS: Bilateral moderate diffuse degenerative change. Small loose bodies are noted. No acute abnormality . No evidence of fracture or dislocation . IMPRESSION: Bilateral moderate diffuse degenerative change. Small loose bodies. No acute abnormality . Electronically Signed   By: Marcello Moores  Register   On: 12/01/2016 11:29   Lab Results  Component Value Date   WBC 7.6 10/08/2016   HGB 14.7 10/08/2016   HCT 43.8 10/08/2016   MCV 92.5 10/08/2016   PLT 232.0 10/08/2016    Lab Results  Component Value Date   CREATININE 0.74 10/08/2016   BUN 14 10/08/2016   NA 140 10/08/2016   K 3.6 10/08/2016   CL  104 10/08/2016   CO2 28 10/08/2016    Lab Results  Component Value Date   ALT 15 10/08/2016   AST 22 10/08/2016   ALKPHOS 75 10/08/2016   BILITOT 0.5 10/08/2016      Physical Exam: BP 110/78   Pulse 80   Ht 5\' 7"  (1.702 m)   Wt 160 lb 2 oz (72.6 kg)   BMI 25.08 kg/m  Constitutional: Pleasant,well-developed, female in no acute distress. HEENT: Normocephalic and atraumatic. Conjunctivae are normal. No scleral icterus. Neck supple.  Cardiovascular: Normal rate, regular rhythm.  Pulmonary/chest: Effort normal and breath sounds normal. No wheezing, rales or rhonchi. Abdominal: Soft, nondistended, nontender. There are no masses palpable. No hepatomegaly. Extremities: no edema Lymphadenopathy: No cervical adenopathy noted. Neurological: Alert and oriented to person place and time. Skin: Skin is warm and dry. No rashes noted. Psychiatric: Normal mood and affect. Behavior is normal.   ASSESSMENT AND PLAN: 66 year old female here to establish care for the following symptoms:  GERD / dysphagia - GERD appears to have responded to PPI however is fearful of side effects of PPIs which we discussed, she also does not wish to take medication daily. In this light will switch her to Zantac 150 mg she can take this as needed. In light of her dysphagia, discussed differential for this with her, and recommend EGD with possible dilation to evaluate and treat. I discussed risks benefits of EGD with her and she wished proceed.  Constipation - discussed options for constipation treatment with her, recommend trial of MiraLAX to use daily and titrate as needed for desired effect. She can follow-up if symptoms persist.  Colon cancer screening - last colonoscopy was limited by poor preparation for note by Dr. Olevia Perches. Recommend colonoscopy at this time for screening purposes in this light. Discussed risks benefits she wished proceed.  [place], MD [facility] Pager  [phone]  CC: Copland, Gay Filler, MD

## 2016-12-17 NOTE — Patient Instructions (Addendum)
If you are age 64 or older, your body mass index should be between 23-30. Your Body mass index is 25.08 kg/m. If this is out of the aforementioned range listed, please consider follow up with your Primary Care Provider.  If you are age 12 or younger, your body mass index should be between 19-25. Your Body mass index is 25.08 kg/m. If this is out of the aformentioned range listed, please consider follow up with your Primary Care Provider.   We have sent the following medications to your pharmacy for you to pick up at your convenience:  [place] have been scheduled for an endoscopy and colonoscopy. Please follow the written instructions given to you at your visit today. Please pick up your prep supplies at the pharmacy within the next 1-3 days. If you use inhalers (even only as needed), please bring them with you on the day of your procedure. Your physician has requested that you go to www.startemmi.com and enter the access code given to you at your visit today. This web site gives a general overview about your procedure. However, you should still follow specific instructions given to you by our office regarding your preparation for the procedure.

## 2016-12-28 NOTE — Progress Notes (Signed)
[name] [address] [place], [place] [address] Phone: [phone] Subjective:    I'm seeing this patient by the request  of:  Kathlene November, MD   CC: Bilateral knee pain f/u  [id number]  Diane [name] is a 66 y.o. female coming in with complaint of bilateral knee pain. 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 Was given injections 4 weeks ago. Patient was do conservative therapy including topical anti-inflammatories and once weekly vitamin D. Patient was to do exercises as well. Patient states  Doing much better overall. Only one exercise seems to be giving her trouble and still some mild discomfort of the right knee with going up and down stairs. No significant instability. No swelling. Feels like she is doing better overall. Staying very active.      Past Medical History:  Diagnosis Date  . Chicken pox   . GERD (gastroesophageal reflux disease)   . Knee pain   . Seasonal allergic rhinitis   . Vitamin D deficiency    Past Surgical History:  Procedure Laterality Date  . COLONOSCOPY  2009  . [place], 2005   Social History   Social History  . Marital status: Married    Spouse name: N/A  . Number of children: N/A  . Years of education: N/A   Social History Main Topics  . Smoking status: Never Smoker  . Smokeless tobacco: Never Used  . Alcohol use No  . Drug use: No  . Sexual activity: Yes    Partners: Male   Other Topics Concern  . None   Social History Narrative  . None   No Known Allergies Family History  Problem Relation Age of Onset  . Heart disease Mother   . Heart disease Father   . Colon cancer Neg Hx   . Stomach cancer Neg Hx     Past medical history, social, surgical and family history all reviewed in electronic medical record.  No pertanent information unless stated regarding to the chief complaint.   Review of Systems: No headache, visual changes, nausea, vomiting, diarrhea,  constipation, dizziness, abdominal pain, skin rash, fevers, chills, night sweats, weight loss, swollen lymph nodes, body aches, joint swelling, muscle aches, chest pain, shortness of breath, mood changes.    Objective  Blood pressure 126/82, pulse 81, height 5\' 7"  (1.702 m), SpO2 98 %.  Systems examined below as of 12/29/16 General: NAD A&O x3 mood, affect normal  HEENT: Pupils equal, extraocular movements intact no nystagmus Respiratory: not short of breath at rest or with speaking Cardiovascular: No lower extremity edema, non tender Skin: Warm dry intact with no signs of infection or rash on extremities or on axial skeleton. Abdomen: Soft nontender, no masses Neuro: Cranial nerves  intact, neurovascularly intact in all extremities with 2+ DTRs and 2+ pulses. Lymph: No lymphadenopathy appreciated today  Gait normal with good balance and coordination.  MSK: Non tender with full range of motion and good stability and symmetric strength and tone of shoulders, elbows, wrist,  knee hips and ankles bilaterally.   Knee: Bilateral Mild valgus deformity noted of the knees bilaterally. Mild tenderness to palpation still over the patellofemoral joint denies much over the medial joint. ROM full in flexion and extension and lower leg rotation. Mild instability noted with valgus force bilaterally still present Negative Mcmurray's, Apley's, and Thessalonian tests. painful patellar compression. Patellar glide with mild crepitus. Only on the right side. Patellar and quadriceps tendons  unremarkable. Hamstring and quadriceps strength is normal.  Improvement from previous exam.   Impression and Recommendations:     This case required medical decision making of moderate complexity.      Note: This dictation was prepared with Dragon dictation along with smaller phrase technology. Any transcriptional errors that result from this process are unintentional.

## 2016-12-29 ENCOUNTER — Encounter: Payer: Self-pay | Admitting: Family Medicine

## 2016-12-29 ENCOUNTER — Ambulatory Visit (INDEPENDENT_AMBULATORY_CARE_PROVIDER_SITE_OTHER): Payer: BC Managed Care – PPO | Admitting: Family Medicine

## 2016-12-29 DIAGNOSIS — M170 Bilateral primary osteoarthritis of knee: Secondary | ICD-10-CM | POA: Diagnosis not present

## 2016-12-29 NOTE — Assessment & Plan Note (Addendum)
Better overall. We discussed icing regimen and home exercises. Discussed icing and discussed patient to continue to stay active. Has topical anti-inflammatories as needed. Worsening symptoms she would be a candidate for viscous supplementation otherwise follow-up as needed.

## 2017-01-26 ENCOUNTER — Encounter: Payer: Self-pay | Admitting: Gastroenterology

## 2017-02-09 ENCOUNTER — Encounter: Payer: Self-pay | Admitting: Gastroenterology

## 2017-02-09 ENCOUNTER — Ambulatory Visit (AMBULATORY_SURGERY_CENTER): Payer: BC Managed Care – PPO | Admitting: Gastroenterology

## 2017-02-09 VITALS — BP 126/65 | HR 66 | Temp 98.2°F | Resp 14 | Ht 67.0 in | Wt 160.0 lb

## 2017-02-09 DIAGNOSIS — K209 Esophagitis, unspecified: Secondary | ICD-10-CM | POA: Diagnosis not present

## 2017-02-09 DIAGNOSIS — K5900 Constipation, unspecified: Secondary | ICD-10-CM | POA: Diagnosis not present

## 2017-02-09 DIAGNOSIS — R1310 Dysphagia, unspecified: Secondary | ICD-10-CM | POA: Diagnosis present

## 2017-02-09 DIAGNOSIS — K222 Esophageal obstruction: Secondary | ICD-10-CM | POA: Diagnosis not present

## 2017-02-09 DIAGNOSIS — D120 Benign neoplasm of cecum: Secondary | ICD-10-CM | POA: Diagnosis not present

## 2017-02-09 DIAGNOSIS — D123 Benign neoplasm of transverse colon: Secondary | ICD-10-CM | POA: Diagnosis not present

## 2017-02-09 MED ORDER — SODIUM CHLORIDE 0.9 % IV SOLN: 500.0000 mL | INTRAVENOUS | Status: DC

## 2017-02-09 NOTE — Progress Notes (Signed)
To PACU, vss patent aw report to rn 

## 2017-02-09 NOTE — Patient Instructions (Signed)
YOU HAD AN ENDOSCOPIC PROCEDURE TODAY AT [facility]:   Refer to the procedure report that was given to you for any specific questions about what was found during the examination.  If the procedure report does not answer your questions, please call your gastroenterologist to clarify.  If you requested that your care partner not be given the details of your procedure findings, then the procedure report has been included in a sealed envelope for you to review at your convenience later.  YOU SHOULD EXPECT: Some feelings of bloating in the abdomen. Passage of more gas than usual.  Walking can help get rid of the air that was put into your GI tract during the procedure and reduce the bloating. If you had a lower endoscopy (such as a colonoscopy or flexible sigmoidoscopy) you may notice spotting of blood in your stool or on the toilet paper. If you underwent a bowel prep for your procedure, you may not have a normal bowel movement for a few days.  Please Note:  You might notice some irritation and congestion in your nose or some drainage.  This is from the oxygen used during your procedure.  There is no need for concern and it should clear up in a day or so.  SYMPTOMS TO REPORT IMMEDIATELY:   Following lower endoscopy (colonoscopy or flexible sigmoidoscopy):  Excessive amounts of blood in the stool  Significant tenderness or worsening of abdominal pains  Swelling of the abdomen that is new, acute  Fever of 100F or higher   Following upper endoscopy (EGD)  Vomiting of blood or coffee ground material  New chest pain or pain under the shoulder blades  Painful or persistently difficult swallowing  New shortness of breath  Fever of 100F or higher  Black, tarry-looking stools  For urgent or emergent issues, a gastroenterologist can be reached at any hour by calling [phone].   DIET: FOLLOW DILATION DIET HANDOUT.    ACTIVITY:  You should plan to take it easy for the rest of  today and you should NOT DRIVE or use heavy machinery until tomorrow (because of the sedation medicines used during the test).    FOLLOW UP: Our staff will call the number listed on your records the next business day following your procedure to check on you and address any questions or concerns that you may have regarding the information given to you following your procedure. If we do not reach you, we will leave a message.  However, if you are feeling well and you are not experiencing any problems, there is no need to return our call.  We will assume that you have returned to your regular daily activities without incident.  If any biopsies were taken you will be contacted by phone or by letter within the next 1-3 weeks.  Please call us at [phone] if you have not heard about the biopsies in 3 weeks.    SIGNATURES/CONFIDENTIALITY: You and/or your care partner have signed paperwork which will be entered into your electronic medical record.  These signatures attest to the fact that that the information above on your After Visit Summary has been reviewed and is understood.  Full responsibility of the confidentiality of this discharge information lies with you and/or your care-partner.   No ibuprofen,naproxen,or other non-steroidal anti-inflammatory drugs for 2 weeks,resume remainder of medications. Information given on polyps,hemorrhoids,Hiatal Hernia, Dilation Diet  And Stricture.

## 2017-02-09 NOTE — Progress Notes (Signed)
Called to room to assist during endoscopic procedure.  Patient ID and intended procedure confirmed with present staff. Received instructions for my participation in the procedure from the performing physician.  

## 2017-02-09 NOTE — Op Note (Signed)
[facility] Patient Name: Diane [name] Procedure Date: 02/09/2017 2:23 PM MRN: [id number] Endoscopist: Remo Lipps P. Shadeed Colberg MD, MD Age: 66 Referring MD:  Date of Birth: [birth year] Gender: Female Account #: [id number] Procedure:                Upper GI endoscopy Indications:              Dysphagia, history of reflux Medicines:                Monitored Anesthesia Care Procedure:                Pre-Anesthesia Assessment:                           - Prior to the procedure, a History and Physical                            was performed, and patient medications and                            allergies were reviewed. The patient's tolerance of                            previous anesthesia was also reviewed. The risks                            and benefits of the procedure and the sedation                            options and risks were discussed with the patient.                            All questions were answered, and informed consent                            was obtained. Prior Anticoagulants: The patient has                            taken no previous anticoagulant or antiplatelet                            agents. ASA Grade Assessment: II - A patient with                            mild systemic disease. After reviewing the risks                            and benefits, the patient was deemed in                            satisfactory condition to undergo the procedure.                           After obtaining informed consent, the endoscope was  passed under direct vision. Throughout the                            procedure, the patient's blood pressure, pulse, and                            oxygen saturations were monitored continuously. The                            Model GIF-HQ190 [id number]) scope was introduced                            through the mouth, and advanced to the second part                            of duodenum. The  upper GI endoscopy was                            accomplished without difficulty. The patient                            tolerated the procedure well. Scope In: Scope Out: Findings:                 Esophagogastric landmarks were identified: the                            Z-line was found at 36 cm, the gastroesophageal                            junction was found at 36 cm and the upper extent of                            the gastric folds was found at 38 cm from the                            incisors.                           A 2 cm hiatal hernia was present.                           One moderate (circumferential scarring or stenosis;                            an endoscope may pass) benign-appearing, intrinsic                            stenosis was found 36 cm from the incisors,                            associated with some mild esophagitis. This                            measured less than  one cm (in length) and was                            traversed. A TTS dilator was passed through the                            scope. Dilation with an 18-19-20 mm balloon dilator                            was performed to 27mm and then 19 mm with a                            moderate mucosal wrent. Biopsies were taken with a                            cold forceps for histology and to open it further.                           The exam of the esophagus was otherwise normal.                           The entire examined stomach was normal.                           The duodenal bulb and second portion of the                            duodenum were normal. Complications:            No immediate complications. Estimated blood loss:                            Minimal. Estimated Blood Loss:     Estimated blood loss was minimal. Impression:               - Esophagogastric landmarks identified.                           - 2 cm hiatal hernia.                           - Benign-appearing  esophageal stenosis with mild                            esophagitis. Dilated to 37mm. Biopsied.                           - Normal stomach.                           - Normal duodenal bulb and second portion of the                            duodenum. Recommendation:           - Patient has a contact number available for  emergencies. The signs and symptoms of potential                            delayed complications were discussed with the                            patient. Return to normal activities tomorrow.                            Written discharge instructions were provided to the                            patient.                           - Resume previous diet.                           - Continue present medications.                           - Resume omeprazole 20mg  daily for the next month                           - Await pathology results.                           - Follow up as needed for further dilation if                            symptoms persist Ralpheal Zappone P. [name] MD, MD 02/09/2017 3:25:12 PM This report has been signed electronically.

## 2017-02-09 NOTE — Op Note (Signed)
[facility] Patient Name: Diane [name] Procedure Date: 02/09/2017 2:23 PM MRN: [id number] Endoscopist: Remo Lipps P. Artez Regis MD, MD Age: 66 Referring MD:  Date of Birth: 03-23-1951 Gender: Female Account #: [id number] Procedure:                Colonoscopy Indications:              Hematochezia in the setting of Constipation, last                            screening 9 years ago Medicines:                Monitored Anesthesia Care Procedure:                Pre-Anesthesia Assessment:                           - Prior to the procedure, a History and Physical                            was performed, and patient medications and                            allergies were reviewed. The patient's tolerance of                            previous anesthesia was also reviewed. The risks                            and benefits of the procedure and the sedation                            options and risks were discussed with the patient.                            All questions were answered, and informed consent                            was obtained. Prior Anticoagulants: The patient has                            taken no previous anticoagulant or antiplatelet                            agents. ASA Grade Assessment: II - A patient with                            mild systemic disease. After reviewing the risks                            and benefits, the patient was deemed in                            satisfactory condition to undergo the procedure.  After obtaining informed consent, the colonoscope                            was passed under direct vision. Throughout the                            procedure, the patient's blood pressure, pulse, and                            oxygen saturations were monitored continuously. The                            Model CF-HQ190L [id number]) scope was introduced                            through the anus and advanced  to the the cecum,                            identified by appendiceal orifice and ileocecal                            valve. The colonoscopy was performed without                            difficulty. The patient tolerated the procedure                            well. The quality of the bowel preparation was                            good. The ileocecal valve, appendiceal orifice, and                            rectum were photographed. Scope In: 2:42:36 PM Scope Out: 3:12:10 PM Scope Withdrawal Time: 0 hours 22 minutes 57 seconds  Total Procedure Duration: 0 hours 29 minutes 34 seconds  Findings:                 The perianal exam findings include non-thrombosed                            external hemorrhoids.                           A 4 mm polyp was found in the cecum. The polyp was                            flat. The polyp was removed with a cold snare.                            Resection and retrieval were complete.                           A 6 mm polyp was found in the hepatic flexure. The  polyp was sessile. The polyp was removed with a                            cold snare. Resection and retrieval were complete.                           A 7 mm polyp was found in the transverse colon. The                            polyp was sessile. The polyp was removed with a                            cold snare. Resection and retrieval were complete.                           Internal hemorrhoids were found during                            retroflexion. The hemorrhoids were moderate.                           The exam was otherwise without abnormality but was                            significantly tortous with looping. Complications:            No immediate complications. Estimated blood loss:                            Minimal. Estimated Blood Loss:     Estimated blood loss was minimal. Impression:               - Non-thrombosed external hemorrhoids  found on                            perianal exam.                           - One 4 mm polyp in the cecum, removed with a cold                            snare. Resected and retrieved.                           - One 6 mm polyp at the hepatic flexure, removed                            with a cold snare. Resected and retrieved.                           - One 7 mm polyp in the transverse colon, removed                            with a cold snare. Resected and retrieved.                           -  Internal hemorrhoids.                           - The examination was otherwise normal.                           Overall, suspect hemorrhoids are the cause of                            bleeding symptoms in the setting of constipation. Recommendation:           - Patient has a contact number available for                            emergencies. The signs and symptoms of potential                            delayed complications were discussed with the                            patient. Return to normal activities tomorrow.                            Written discharge instructions were provided to the                            patient.                           - Resume previous diet.                           - Continue present medications.                           - Continue Miralax as needed for constipation                           - If bleeding symptoms persist please contact us                            for consideration for hemorrhoid banding                           - No ibuprofen, naproxen, or other non-steroidal                            anti-inflammatory drugs for 2 weeks after polyp                            removal.                           - Await pathology results.                           - Repeat colonoscopy is recommended for  surveillance. The colonoscopy date will be                            determined after pathology results from  today's                            exam become available for review. Remo Lipps P. Lucia Harm MD, MD 02/09/2017 3:19:45 PM This report has been signed electronically.

## 2017-02-10 ENCOUNTER — Telehealth: Payer: Self-pay | Admitting: Gastroenterology

## 2017-02-10 ENCOUNTER — Telehealth: Payer: Self-pay | Admitting: *Deleted

## 2017-02-10 NOTE — Telephone Encounter (Signed)
  Follow up Call-  Call back number 02/09/2017  Post procedure Call Back phone  # [phone] hm  Permission to leave phone message Yes  Some recent data might be hidden     Patient questions:  Do you have a fever, pain , or abdominal swelling? No. Pain Score  0 *  Have you tolerated food without any problems? Yes.    Have you been able to return to your normal activities? Yes.    Do you have any questions about your discharge instructions: Diet   No. Medications  No. Follow up visit  No.  Do you have questions or concerns about your Care? No.  Actions: * If pain score is 4 or above: No action needed, pain <4.

## 2017-02-10 NOTE — Telephone Encounter (Signed)
Can you please let her know to just take omeprazole 20mg  once daily at baseline. If she has breakthrough heartburn despite this she can take zantac as needed. Thanks

## 2017-02-10 NOTE — Telephone Encounter (Signed)
Spoke to patient, she understands now to take the omeprazole 20 mg daily and zantac prn for breakthrough heartburn.

## 2017-02-10 NOTE — Telephone Encounter (Signed)
Routed to Dr. Armbruster. 

## 2017-02-18 ENCOUNTER — Encounter: Payer: Self-pay | Admitting: Gastroenterology

## 2017-05-25 ENCOUNTER — Ambulatory Visit (INDEPENDENT_AMBULATORY_CARE_PROVIDER_SITE_OTHER): Payer: BC Managed Care – PPO | Admitting: Family Medicine

## 2017-05-25 ENCOUNTER — Encounter: Payer: Self-pay | Admitting: Family Medicine

## 2017-05-25 DIAGNOSIS — M1711 Unilateral primary osteoarthritis, right knee: Secondary | ICD-10-CM | POA: Diagnosis not present

## 2017-05-25 NOTE — Progress Notes (Signed)
[name] [address], [place] [address] Phone: [phone] Subjective:     CC: Right knee pain follow-up  [id number]  Diane [name] is a 66 y.o. female coming in with complaint of right knee pain. 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 Patient is having worsening right knee and then stops daily activities. Patient states that some mild swelling. Worsening symptoms overall.    Past Medical History:  Diagnosis Date  . Allergy   . Chicken pox   . GERD (gastroesophageal reflux disease)   . Knee pain   . Seasonal allergic rhinitis   . Vitamin D deficiency    Past Surgical History:  Procedure Laterality Date  . COLONOSCOPY  2009  . [place], 2005  . wisdom teeth exraction     Social History   Social History  . Marital status: Married    Spouse name: N/A  . Number of children: N/A  . Years of education: N/A   Social History Main Topics  . Smoking status: Never Smoker  . Smokeless tobacco: Never Used  . Alcohol use No  . Drug use: No  . Sexual activity: Yes    Partners: Male   Other Topics Concern  . Not on file   Social History Narrative  . No narrative on file   No Known Allergies Family History  Problem Relation Age of Onset  . Heart disease Mother   . Heart disease Father   . Colon cancer Neg Hx   . Stomach cancer Neg Hx   . Esophageal cancer Neg Hx   . Pancreatic cancer Neg Hx   . Rectal cancer Neg Hx     Past medical history, social, surgical and family history all reviewed in electronic medical record.  No pertanent information unless stated regarding to the chief complaint.   Review of Systems:Review of systems updated and as accurate as of 05/25/17  No headache, visual changes, nausea, vomiting, diarrhea, constipation, dizziness, abdominal pain, skin rash, fevers, chills, night sweats, weight loss, swollen  lymph nodes, body aches, joint swelling,  chest pain, shortness of breath, mood changes.  Positive muscle aches  Objective  Systems examined below as of 05/25/17   General: No apparent distress alert and oriented x3 mood and affect normal, dressed appropriately.  HEENT: Pupils equal, extraocular movements intact  Respiratory: Patient's speak in full sentences and does not appear short of breath  Cardiovascular: No lower extremity edema, non tender, no erythema  Skin: Warm dry intact with no signs of infection or rash on extremities or on axial skeleton.  Abdomen: Soft nontender  Neuro: Cranial nerves II through XII are intact, neurovascularly intact in all extremities with 2+ DTRs and 2+ pulses.  Lymph: No lymphadenopathy of posterior or anterior cervical chain or axillae bilaterally.  Gait normal with good balance and coordination.  MSK:  Non tender with full range of motion and good stability and symmetric strength and tone of shoulders, elbows, wrist, hip, and ankles bilaterally.   Knee: Right valgus deformity noted.  Tender to palpation over medial and PF joint line.  Range of motion with last 5 of extension secondary to crepitus of the patellar kneecap instability with valgus force.  painful patellar compression. Patellar glide with severe crepitus. Patellar and quadriceps tendons unremarkable. Hamstring and quadriceps strength is normal. Contralateral knee shows mild arthritic changes with very mild instability.  After informed written and verbal  consent, patient was seated on exam table. Right knee was prepped with alcohol swab and utilizing anterolateral approach, patient's right knee space was injected with 4:1  marcaine 0.5%: Kenalog 40mg /dL. Patient tolerated the procedure well without immediate complications.    Impression and Recommendations:     This case required medical decision making of moderate complexity.      Note: This dictation was prepared with Dragon  dictation along with smaller phrase technology. Any transcriptional errors that result from this process are unintentional.

## 2017-05-25 NOTE — Assessment & Plan Note (Signed)
Worsening symptoms. Patient was given an injection today. Given a true pull leg. We discussed that any worsening symptoms she could be a candidate for viscous supplementation. Possible advance imaging may be necessary as well as indurated potential loose bodies. Follow-up again in 2-6 weeks.

## 2017-06-07 NOTE — Progress Notes (Signed)
[name] [address] [place], [place] [address] Phone: [phone] Subjective:     CC: Right knee pain follow-up  [id number]  Diane [name] is a 66 y.o. female coming in with complaint of right knee pain. Patient does have past medical history significant for moderate Arthritis. Patient was having worsening pain in the right knee. Patient will S given a steroid injection 05/25/2017. Patient states minimal improvement. Patient was also given a brace with very minimal improvement. Still affecting daily activities. Patient is concerned because sometimes feels like there is some instability of the wrist as well.    Past Medical History:  Diagnosis Date  . Allergy   . Chicken pox   . GERD (gastroesophageal reflux disease)   . Knee pain   . Seasonal allergic rhinitis   . Vitamin D deficiency    Past Surgical History:  Procedure Laterality Date  . COLONOSCOPY  2009  . [place], 2005  . wisdom teeth exraction     Social History   Social History  . Marital status: Married    Spouse name: N/A  . Number of children: N/A  . Years of education: N/A   Social History Main Topics  . Smoking status: Never Smoker  . Smokeless tobacco: Never Used  . Alcohol use No  . Drug use: No  . Sexual activity: Yes    Partners: Male   Other Topics Concern  . Not on file   Social History Narrative  . No narrative on file   No Known Allergies Family History  Problem Relation Age of Onset  . Heart disease Mother   . Heart disease Father   . Colon cancer Neg Hx   . Stomach cancer Neg Hx   . Esophageal cancer Neg Hx   . Pancreatic cancer Neg Hx   . Rectal cancer Neg Hx     Past medical history, social, surgical and family history all reviewed in electronic medical record.  No pertanent information unless stated regarding to the chief complaint.   Review of Systems: No headache, visual changes, nausea, vomiting, diarrhea, constipation,  dizziness, abdominal pain, skin rash, fevers, chills, night sweats, weight loss, swollen lymph nodes, body aches,  chest pain, shortness of breath, mood changes.  Positive muscle aches and joint swelling  Objective    Systems examined below as of 06/08/17 General: NAD A&O x3 mood, affect normal  HEENT: Pupils equal, extraocular movements intact no nystagmus Respiratory: not short of breath at rest or with speaking Cardiovascular: No lower extremity edema, non tender Skin: Warm dry intact with no signs of infection or rash on extremities or on axial skeleton. Abdomen: Soft nontender, no masses Neuro: Cranial nerves  intact, neurovascularly intact in all extremities with 2+ DTRs and 2+ pulses. Lymph: No lymphadenopathy appreciated today  Gait normal with good balance and coordination.  MSK: Non tender with full range of motion and good stability and symmetric strength and tone of shoulders, elbows, wrist,  knee hips and ankles bilaterally.   Knee: Right Lateral translation of patella Positive pain over the lateral and superior aspect of the patella ROM full in flexion and extension and lower leg rotation. Ligaments with solid consistent endpoints including ACL, PCL, LCL, MCL. Negative Mcmurray's, Apley's, and Thessalonian tests. painful patellar compression. Patellar glide without crepitus. Patellar and quadriceps tendons unremarkable. Hamstring and quadriceps strength is normal.  Contralateral knee unremarkable.   After informed written and verbal consent, patient was seated on  exam table. Right knee was prepped with alcohol swab and utilizing anterolateral approach, patient's right knee space was injected with 16 mg/2.5 mL of Synvisc (sodium hyaluronate) in a prefilled syringe was injected easily into the knee through a 22-gauge needle.. Patient tolerated the procedure well without immediate complications.    Impression and Recommendations:     This case required medical decision  making of moderate complexity.      Note: This dictation was prepared with Dragon dictation along with smaller phrase technology. Any transcriptional errors that result from this process are unintentional.

## 2017-06-08 ENCOUNTER — Ambulatory Visit (INDEPENDENT_AMBULATORY_CARE_PROVIDER_SITE_OTHER): Payer: BC Managed Care – PPO | Admitting: Family Medicine

## 2017-06-08 ENCOUNTER — Encounter: Payer: Self-pay | Admitting: Family Medicine

## 2017-06-08 DIAGNOSIS — M1711 Unilateral primary osteoarthritis, right knee: Secondary | ICD-10-CM

## 2017-06-08 NOTE — Assessment & Plan Note (Signed)
Worsening pain. Patient started on viscous supplementation after failing all conservative therapy. Continues to have some instability encouraged to wear the brace. We discussed icing regimen. Patient will continue conservative therapy otherwise will follow-up again in 1 week for second in a series of 3 injections for the viscous supplementation.

## 2017-06-08 NOTE — Patient Instructions (Addendum)
Good to see you  Alvera Singh is your friend.  You will get to know me well.  See you next week.

## 2017-06-22 ENCOUNTER — Ambulatory Visit (INDEPENDENT_AMBULATORY_CARE_PROVIDER_SITE_OTHER): Payer: BC Managed Care – PPO | Admitting: Family Medicine

## 2017-06-22 ENCOUNTER — Encounter: Payer: Self-pay | Admitting: Family Medicine

## 2017-06-22 DIAGNOSIS — M1711 Unilateral primary osteoarthritis, right knee: Secondary | ICD-10-CM | POA: Diagnosis not present

## 2017-06-22 NOTE — Progress Notes (Signed)
[name] [place], [name] [address] Phone: [phone] Subjective:     CC: Right knee pain follow-up  [id number]  Diane [name] is a 66 y.o. female coming in with complaint of right knee pain. Patient is in severe patellofemoral arthritis. Patient has failed all conservative therapy. Patient started with home exercises. Patient also did have viscous supplementation last week. No improvement yet.     Past Medical History:  Diagnosis Date  . Allergy   . Chicken pox   . GERD (gastroesophageal reflux disease)   . Knee pain   . Seasonal allergic rhinitis   . Vitamin D deficiency    Past Surgical History:  Procedure Laterality Date  . COLONOSCOPY  2009  . [place], 2005  . wisdom teeth exraction     Social History   Social History  . Marital status: Married    Spouse name: Diane [name]  . Number of children: Diane [name]  . Years of education: Diane [name]   Social History Main Topics  . Smoking status: Never Smoker  . Smokeless tobacco: Never Used  . Alcohol use No  . Drug use: No  . Sexual activity: Yes    Partners: Male   Other Topics Concern  . None   Social History Narrative  . None   No Known Allergies Family History  Problem Relation Age of Onset  . Heart disease Mother   . Heart disease Father   . Colon cancer Neg Hx   . Stomach cancer Neg Hx   . Esophageal cancer Neg Hx   . Pancreatic cancer Neg Hx   . Rectal cancer Neg Hx     Past medical history, social, surgical and family history all reviewed in electronic medical record.  No pertanent information unless stated regarding to the chief complaint.   Review of Systems: No headache, visual changes, nausea, vomiting, diarrhea, constipation, dizziness, abdominal pain, skin rash, fevers, chills, night sweats, weight loss, swollen lymph nodes, body aches, joint swelling, muscle aches, chest pain, shortness of breath, mood changes.    Objective  Blood pressure 124/78,  pulse 60, weight 149 lb (67.6 kg).    Systems examined below as of 06/22/17 General: NAD A&O x3 mood, affect normal  HEENT: Pupils equal, extraocular movements intact no nystagmus Respiratory: not short of breath at rest or with speaking Cardiovascular: No lower extremity edema, non tender Skin: Warm dry intact with no signs of infection or rash on extremities or on axial skeleton. Abdomen: Soft nontender, no masses Neuro: Cranial nerves  intact, neurovascularly intact in all extremities with 2+ DTRs and 2+ pulses. Lymph: No lymphadenopathy appreciated today  Gait normal with good balance and coordination.  MSK: Non tender with full range of motion and good stability and symmetric strength and tone of shoulders, elbows, wrist,  hips and ankles bilaterally.   Knee:Right valgus deformity noted. Tender to palpation over medial and PF joint line.  ROM full in flexion and extension and lower leg rotation. Mild instability of the kneecap painful patellar compression. Patellar glide with moderate crepitus. Patellar and quadriceps tendons unremarkable. Hamstring and quadriceps strength is normal. Contralateral knee shows mild arthritic changes   After informed written and verbal consent, patient was seated on exam table. Right knee was prepped with alcohol swab and utilizing anterolateral approach, patient's right knee space was injected with 16 mg/2.5 mL of Synvisc (sodium hyaluronate) in a prefilled syringe was injected easily into the knee through a 22-gauge  needle.. Patient tolerated the procedure well without immediate complications.    Impression and Recommendations:     This case required medical decision making of moderate complexity.      Note: This dictation was prepared with Dragon dictation along with smaller phrase technology. Any transcriptional errors that result from this process are unintentional.

## 2017-06-22 NOTE — Assessment & Plan Note (Signed)
Second in a series of 3 injections given today. Continue conservative therapy. Follow-up in one week for third and final injection.

## 2017-06-29 ENCOUNTER — Ambulatory Visit (INDEPENDENT_AMBULATORY_CARE_PROVIDER_SITE_OTHER): Payer: BC Managed Care – PPO | Admitting: Family Medicine

## 2017-06-29 ENCOUNTER — Encounter: Payer: Self-pay | Admitting: Family Medicine

## 2017-06-29 DIAGNOSIS — M1711 Unilateral primary osteoarthritis, right knee: Secondary | ICD-10-CM

## 2017-06-29 NOTE — Progress Notes (Signed)
[name] [place],  [address] Phone: [phone] Subjective:     CC: Right knee arthritis follow-up  [id number]  Diane [name] is a 66 y.o. female coming in with complaint of right knee arthritis. Patient has failed conservative therapy and is here for third in a series of 3 injections for viscous supplementation. Has noticed some improvement with last injection. Denies any numbness, any swelling, still some pain with repetitive activity but is making some improvement.     Past Medical History:  Diagnosis Date  . Allergy   . Chicken pox   . GERD (gastroesophageal reflux disease)   . Knee pain   . Seasonal allergic rhinitis   . Vitamin D deficiency    Past Surgical History:  Procedure Laterality Date  . COLONOSCOPY  2009  . [place], 2005  . wisdom teeth exraction     Social History   Social History  . Marital status: Married    Spouse name: N/A  . Number of children: N/A  . Years of education: N/A   Social History Main Topics  . Smoking status: Never Smoker  . Smokeless tobacco: Never Used  . Alcohol use No  . Drug use: No  . Sexual activity: Yes    Partners: Male   Other Topics Concern  . Not on file   Social History Narrative  . No narrative on file   No Known Allergies Family History  Problem Relation Age of Onset  . Heart disease Mother   . Heart disease Father   . Colon cancer Neg Hx   . Stomach cancer Neg Hx   . Esophageal cancer Neg Hx   . Pancreatic cancer Neg Hx   . Rectal cancer Neg Hx     Past medical history, social, surgical and family history all reviewed in electronic medical record.  No pertanent information unless stated regarding to the chief complaint.   Review of Systems:Review of systems updated and as accurate as of 06/29/17  No headache, visual changes, nausea, vomiting, diarrhea, constipation, dizziness, abdominal pain, skin rash, fevers, chills, night sweats,  weight loss, swollen lymph nodes, body aches, joint swelling, muscle aches, chest pain, shortness of breath, mood changes.   Objective  Blood pressure 120/70, pulse 68, height 5\' 7"  (1.702 m), weight 148 lb (67.1 kg), SpO2 98 %. Systems examined below as of 06/29/17   General: No apparent distress alert and oriented x3 mood and affect normal, dressed appropriately.  HEENT: Pupils equal, extraocular movements intact  Respiratory: Patient's speak in full sentences and does not appear short of breath  Cardiovascular: No lower extremity edema, non tender, no erythema  Skin: Warm dry intact with no signs of infection or rash on extremities or on axial skeleton.  Abdomen: Soft nontender  Neuro: Cranial nerves II through XII are intact, neurovascularly intact in all extremities with 2+ DTRs and 2+ pulses.  Lymph: No lymphadenopathy of posterior or anterior cervical chain or axillae bilaterally.  Gait normal with good balance and coordination.  MSK:  Non tender with full range of motion and good stability and symmetric strength and tone of shoulders, elbows, wrist, hip, and ankles bilaterally.  Knee: Right valgus deformity noted.  Tender to palpation over medial and PF joint line.  ROM full in flexion and extension and lower leg rotation. instability with valgus force.  painful patellar compression. Patellar glide with moderate crepitus. Patellar and quadriceps tendons unremarkable. Hamstring and quadriceps strength  is normal. Contralateral knee shows minimal changes  After informed written and verbal consent, patient was seated on exam table. Right knee was prepped with alcohol swab and utilizing anterolateral approach, patient's right knee space was injected with16 mg/2.5 mL of Synvisc (sodium hyaluronate) in a prefilled syringe was injected easily into the knee through a 22-gauge needle. Patient tolerated the procedure well without immediate complications.   Impression and Recommendations:      This case required medical decision making of moderate complexity.      Note: This dictation was prepared with Dragon dictation along with smaller phrase technology. Any transcriptional errors that result from this process are unintentional.

## 2017-06-29 NOTE — Patient Instructions (Signed)
Good to go  Get a break for 4 weeks Call us if you need anything.

## 2017-06-29 NOTE — Assessment & Plan Note (Signed)
   Final injection given. Discussed icing regimen and home exercises. Discussed which activities to do a which was to avoid. Patient will start increasing activity slowly. Follow-up again in 4-6 weeks.

## 2017-09-16 NOTE — Progress Notes (Signed)
[name] [place] [place], [place] [address] Phone: [phone] Subjective:    I'm seeing this patient by the request  of:    CC: Right shoulder pain  [id number]  Diane [name] is a 66 y.o. female coming in with complaint of right shoulder pain. 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 Her pain has increased over the past week and she is now unable to sleep at night. She has shooting pain into the elbow and into the neck. She is right handed. She said she thinks that she may have had bursitis in this shoulder a long time ago.       Past Medical History:  Diagnosis Date  . Allergy   . Chicken pox   . GERD (gastroesophageal reflux disease)   . Knee pain   . Seasonal allergic rhinitis   . Vitamin D deficiency    Past Surgical History:  Procedure Laterality Date  . COLONOSCOPY  2009  . [place], 2005  . wisdom teeth exraction     Social History   Social History  . Marital status: Married    Spouse name: N/A  . Number of children: N/A  . Years of education: N/A   Social History Main Topics  . Smoking status: Never Smoker  . Smokeless tobacco: Never Used  . Alcohol use No  . Drug use: No  . Sexual activity: Yes    Partners: Male   Other Topics Concern  . Not on file   Social History Narrative  . No narrative on file   No Known Allergies Family History  Problem Relation Age of Onset  . Heart disease Mother   . Heart disease Father   . Colon cancer Neg Hx   . Stomach cancer Neg Hx   . Esophageal cancer Neg Hx   . Pancreatic cancer Neg Hx   . Rectal cancer Neg Hx      Past medical history, social, surgical and family history all reviewed in electronic medical record.  No pertanent information unless stated regarding to the chief complaint.   Review of Systems:Review of systems updated and as accurate as of 09/16/17  No headache, visual changes, nausea,  vomiting, diarrhea, constipation, dizziness, abdominal pain, skin rash, fevers, chills, night sweats, weight loss, swollen lymph nodes, body aches, joint swelling,  chest pain, shortness of breath, mood changes. Positive muscle aches  Objective  There were no vitals taken for this visit. Systems examined below as of 09/16/17   General: No apparent distress alert and oriented x3 mood and affect normal, dressed appropriately.  HEENT: Pupils equal, extraocular movements intact  Respiratory: Patient's speak in full sentences and does not appear short of breath  Cardiovascular: No lower extremity edema, non tender, no erythema  Skin: Warm dry intact with no signs of infection or rash on extremities or on axial skeleton.  Abdomen: Soft nontender  Neuro: Cranial nerves II through XII are intact, neurovascularly intact in all extremities with 2+ DTRs and 2+ pulses.  Lymph: No lymphadenopathy of posterior or anterior cervical chain or axillae bilaterally.  Gait normal with good balance and coordination.  MSK:  Non tender with full range of motion and good stability and symmetric strength and tone of  elbows, wrist, hip, knee and ankles bilaterally.  Shoulder: Right Inspection reveals no abnormalities, atrophy or asymmetry. Palpation is normal with no tenderness over AC joint or  bicipital groove. ROM is full in all planes passively. Rotator cuff strength normal throughout. signs of impingement with positive Neer and Hawkin's tests, but negative empty can sign. Speeds and Yergason's tests normal. No labral pathology noted with negative Obrien's, negative clunk and good stability. Normal scapular function observed. No painful arc and no drop arm sign. No apprehension sign  MSK US performed of: Right This study was ordered, performed, and interpreted by Charlann Boxer D.O.  Shoulder:   Supraspinatus:  Appears normal on long and transverse views, Bursal bulge seen with shoulder abduction on impingement  view. Infraspinatus:  Appears normal on long and transverse views. Significant increase in Doppler flow Subscapularis:  Appears normal on long and transverse views. Positive bursa Teres Minor:  Appears normal on long and transverse views. AC joint:  Capsule undistended, no geyser sign. Glenohumeral Joint:  Appears normal without effusion. Glenoid Labrum:  Intact without visualized tears. Biceps Tendon:  Appears normal on long and transverse views, no fraying of tendon, tendon located in intertubercular groove, no subluxation with shoulder internal or external rotation.  Impression: Subacromial bursitis  Procedure: Real-time Ultrasound Guided Injection of right glenohumeral joint Device: GE Logiq E  Ultrasound guided injection is preferred based studies that show increased duration, increased effect, greater accuracy, decreased procedural pain, increased response rate with ultrasound guided versus blind injection.  Verbal informed consent obtained.  Time-out conducted.  Noted no overlying erythema, induration, or other signs of local infection.  Skin prepped in a sterile fashion.  Local anesthesia: Topical Ethyl chloride.  With sterile technique and under real time ultrasound guidance:  Joint visualized.  23g 1  inch needle inserted posterior approach. Pictures taken for needle placement. Patient did have injection of 2 cc of 1% lidocaine, 2 cc of 0.5% Marcaine, and 1.0 cc of Kenalog 40 mg/dL. Completed without difficulty  Pain immediately resolved suggesting accurate placement of the medication.  Advised to call if fevers/chills, erythema, induration, drainage, or persistent bleeding.  Images permanently stored and available for review in the ultrasound unit.  Impression: Technically successful ultrasound guided injection.    Impression and Recommendations:     This case required medical decision making of moderate complexity.      Note: This dictation was prepared with Dragon  dictation along with smaller phrase technology. Any transcriptional errors that result from this process are unintentional.

## 2017-09-17 ENCOUNTER — Ambulatory Visit (INDEPENDENT_AMBULATORY_CARE_PROVIDER_SITE_OTHER): Payer: Medicare Other | Admitting: Family Medicine

## 2017-09-17 ENCOUNTER — Ambulatory Visit: Payer: Self-pay

## 2017-09-17 ENCOUNTER — Encounter: Payer: Self-pay | Admitting: Family Medicine

## 2017-09-17 VITALS — BP 118/72 | HR 76 | Ht 67.0 in | Wt 152.0 lb

## 2017-09-17 DIAGNOSIS — M25511 Pain in right shoulder: Secondary | ICD-10-CM

## 2017-09-17 DIAGNOSIS — M7551 Bursitis of right shoulder: Secondary | ICD-10-CM

## 2017-09-17 NOTE — Assessment & Plan Note (Signed)
Patient given injection today. Patient given home exercises and icing regimen. We discussed objective is a doing which ones to avoid. We will limit the amount of lifting over the course of next several weeks. Follow-up again in 4 weeks for further evaluation and treatment.

## 2017-09-17 NOTE — Patient Instructions (Addendum)
Good to see you.  Ice 20 minutes 2 times daily. Usually after activity and before bed. Exercises 3 times a week.  pennsaid pinkie amount topically 2 times daily as needed.   COntinue the vitamins  Try to keep hands within peripheral vison.  See me again in 4 weeks.

## 2017-11-02 ENCOUNTER — Ambulatory Visit: Payer: Medicare Other | Admitting: Internal Medicine

## 2017-11-02 ENCOUNTER — Encounter: Payer: Self-pay | Admitting: Internal Medicine

## 2017-11-02 VITALS — BP 126/74 | HR 75 | Temp 97.6°F | Resp 14 | Ht 67.0 in | Wt 151.5 lb

## 2017-11-02 DIAGNOSIS — J019 Acute sinusitis, unspecified: Secondary | ICD-10-CM

## 2017-11-02 MED ORDER — AMOXICILLIN 500 MG PO CAPS: 1000.0000 mg | ORAL_CAPSULE | Freq: Two times a day (BID) | ORAL | 0 refills | Status: DC

## 2017-11-02 NOTE — Progress Notes (Signed)
Subjective:    Patient ID: Diane [name], female    DOB: 08/29/1951, 66 y.o.   MRN: [id number]  DOS:  11/02/2017 Type of visit - description : acute Interval history: Symptoms started approximately 5 days ago discomfort at the submandibular glands, head congestion, mild cough, yesterday he had a severe headache, only at the frontal area, took some Tylenol which decreased symptoms. She is also taking Mucinex DM. Husband has similar symptoms.  Review of Systems  Denies fevers.  No sore throat Had some nausea with the headache but no vomiting no diarrhea No myalgias  Past Medical History:  Diagnosis Date  . Allergy   . Chicken pox   . GERD (gastroesophageal reflux disease)   . Knee pain   . Seasonal allergic rhinitis   . Vitamin D deficiency     Past Surgical History:  Procedure Laterality Date  . COLONOSCOPY  2009  . [place], 2005  . wisdom teeth exraction      Social History   Socioeconomic History  . Marital status: Married    Spouse name: Not on file  . Number of children: Not on file  . Years of education: Not on file  . Highest education level: Not on file  Social Needs  . Financial resource strain: Not on file  . Food insecurity - worry: Not on file  . Food insecurity - inability: Not on file  . Transportation needs - medical: Not on file  . Transportation needs - non-medical: Not on file  Occupational History  . Not on file  Tobacco Use  . Smoking status: Never Smoker  . Smokeless tobacco: Never Used  Substance and Sexual Activity  . Alcohol use: No  . Drug use: No  . Sexual activity: Yes    Partners: Male  Other Topics Concern  . Not on file  Social History Narrative  . Not on file      Allergies as of 11/02/2017   No Known Allergies     Medication List        Accurate as of 11/02/17 11:59 PM. Always use your most recent med list.          amoxicillin 500 MG capsule Commonly known as:  AMOXIL Take 2 capsules (1,000 mg  total) 2 (two) times daily by mouth.   Biotin 1000 MCG tablet Take 1,000 mcg by mouth daily.   Diclofenac Sodium 2 % Soln Commonly known as:  PENNSAID Place 2 application onto the skin 2 (two) times daily.   fluticasone 50 MCG/ACT nasal spray Commonly known as:  FLONASE Place 2 sprays into both nostrils daily.   glucosamine-chondroitin 500-400 MG tablet Take 1 tablet by mouth daily.   multivitamin tablet Take 1 tablet by mouth daily.   omeprazole 20 MG capsule Commonly known as:  PRILOSEC Take 1 capsule (20 mg total) by mouth daily.   ranitidine 150 MG tablet Commonly known as:  ZANTAC Take 1 tablet (150 mg total) by mouth 2 (two) times daily. Take 1-2 tablets daily as needed   VITAMIN C GUMMIES PO Take 1 tablet by mouth daily.   VITAMIN D PO Take by mouth.          Objective:   Physical Exam BP 126/74 (BP Location: Left Arm, Patient Position: Sitting, Cuff Size: Small)   Pulse 75   Temp 97.6 F (36.4 C) (Oral)   Resp 14   Ht 5\' 7"  (1.702 m)   Wt 151 lb 8 oz (  68.7 kg)   SpO2 96%   BMI 23.73 kg/m  General:   Well developed, well nourished . NAD.  HEENT:  Normocephalic . Face symmetric, atraumatic.  TMs normal, throat symmetric, no read. Nose slightly congested, sinuses non-TTP. Lungs:  CTA B Normal respiratory effort, no intercostal retractions, no accessory muscle use. Heart: RRR,  no murmur.  No pretibial edema bilaterally  Skin: Not pale. Not jaundice Neurologic:  alert & oriented X3.  Speech normal, gait appropriate for age and unassisted Psych--  Cognition and judgment appear intact.  Cooperative with normal attention span and concentration.  Behavior appropriate. No anxious or depressed appearing.      Assessment & Plan:   66 year old lady, last visit with me 2013, history of DJD, GERD presents with:  Sinusitis : Likely viral, recommend conservative treatment, if not improving, start penicillin.  See instructions.

## 2017-11-02 NOTE — Progress Notes (Signed)
Pre visit review using our clinic review tool, if applicable. No additional management support is needed unless otherwise documented below in the visit note. 

## 2017-11-02 NOTE — Patient Instructions (Addendum)
Rest, fluids , tylenol  For cough:  Take Mucinex DM twice a day as needed until better  For nasal congestion: Use OTC   Flonase : 2 nasal sprays on each side of the nose in the morning until you feel better   Take the antibiotic as prescribed  (Amoxicillin) only if not improving in 3 or 4 days  Call if not gradually better over the next  10 days  Call anytime if the symptoms are severe

## 2017-12-28 ENCOUNTER — Encounter: Payer: Self-pay | Admitting: Family Medicine

## 2017-12-28 ENCOUNTER — Ambulatory Visit (INDEPENDENT_AMBULATORY_CARE_PROVIDER_SITE_OTHER)
Admission: RE | Admit: 2017-12-28 | Discharge: 2017-12-28 | Disposition: A | Discharge status: Home / Self Care | Payer: Medicare Other | Source: Ambulatory Visit | Attending: Family Medicine | Admitting: Family Medicine

## 2017-12-28 ENCOUNTER — Ambulatory Visit: Payer: Medicare Other | Admitting: Family Medicine

## 2017-12-28 ENCOUNTER — Ambulatory Visit: Payer: Self-pay

## 2017-12-28 VITALS — BP 130/82 | HR 79 | Ht 67.0 in | Wt 151.0 lb

## 2017-12-28 DIAGNOSIS — M170 Bilateral primary osteoarthritis of knee: Secondary | ICD-10-CM

## 2017-12-28 DIAGNOSIS — M25562 Pain in left knee: Secondary | ICD-10-CM

## 2017-12-28 DIAGNOSIS — M25561 Pain in right knee: Secondary | ICD-10-CM

## 2017-12-28 NOTE — Progress Notes (Signed)
[name] [place], [place] [address] Phone: [phone] Subjective:    I'm seeing this patient by the request  of:    CC: Bilateral knee pain  [id number]  Diane [name] is a 67 y.o. female coming in with complaint of bilateral knee pain. Has some swelling in her knees posterior. After she fell she was on her feet for about 5 hours.  Onset- Saturday Location- Tibial tuberosity Duration-  Character- Right sharp dull pain in the medial Aggravating factors- Bending Reliving factors- Ice, Tylenol  Therapies tried-  Severity-9 out of 10.  Feels that the right is worse than the left.  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  Finished Visco supplementation in 6 months ago.     Past Medical History:  Diagnosis Date  . Allergy   . Chicken pox   . GERD (gastroesophageal reflux disease)   . Knee pain   . Seasonal allergic rhinitis   . Vitamin D deficiency    Past Surgical History:  Procedure Laterality Date  . COLONOSCOPY  2009  . [place], 2005  . wisdom teeth exraction     Social History   Socioeconomic History  . Marital status: Married    Spouse name: None  . Number of children: None  . Years of education: None  . Highest education level: None  Social Needs  . Financial resource strain: None  . Food insecurity - worry: None  . Food insecurity - inability: None  . Transportation needs - medical: None  . Transportation needs - non-medical: None  Occupational History  . None  Tobacco Use  . Smoking status: Never Smoker  . Smokeless tobacco: Never Used  Substance and Sexual Activity  . Alcohol use: No  . Drug use: No  . Sexual activity: Yes    Partners: Male  Other Topics Concern  . None  Social History Narrative  . None   No Known Allergies Family History  Problem Relation Age of Onset  . Heart disease Mother   . Heart disease Father   . Colon cancer Neg Hx   . Stomach  cancer Neg Hx   . Esophageal cancer Neg Hx   . Pancreatic cancer Neg Hx   . Rectal cancer Neg Hx      Past medical history, social, surgical and family history all reviewed in electronic medical record.  No pertanent information unless stated regarding to the chief complaint.   Review of Systems:Review of systems updated and as accurate as of 12/28/17  No headache, visual changes, nausea, vomiting, diarrhea, constipation, dizziness, abdominal pain, skin rash, fevers, chills, night sweats, weight loss, swollen lymph nodes, body aches, joint swelling, chest pain, shortness of breath, mood changes.  Positive muscle aches  Objective  Blood pressure 130/82, pulse 79, height 5\' 7"  (1.702 m), weight 151 lb (68.5 kg), SpO2 98 %. Systems examined below as of 12/28/17   General: No apparent distress alert and oriented x3 mood and affect normal, dressed appropriately.  HEENT: Pupils equal, extraocular movements intact  Respiratory: Patient's speak in full sentences and does not appear short of breath  Cardiovascular: No lower extremity edema, non tender, no erythema  Skin: Warm dry intact with no signs of infection or rash on extremities or on axial skeleton.  Abdomen: Soft nontender  Neuro: Cranial nerves II through XII are intact, neurovascularly intact in all extremities with 2+ DTRs and 2+ pulses.  Lymph: No lymphadenopathy of  posterior or anterior cervical chain or axillae bilaterally.  Gait normal with good balance and coordination.  MSK:  Non tender with full range of motion and good stability and symmetric strength and tone of shoulders, elbows, wrist, hip, and ankles bilaterally.  Knee: Right valgus deformity noted. Large thigh to calf ratio.  Significant effusion noted Tender to palpation over medial and PF joint line.  ROM full in flexion and extension and lower leg rotation. instability with valgus force.  painful patellar compression. Patellar glide with moderate crepitus. Patellar  and quadriceps tendons unremarkable. Hamstring and quadriceps strength is normal. Contralateral knee shows arthritic changes of pain over the medial joint line.  No effusion noted as well but trace comparatively.  After informed written and verbal consent, patient was seated on exam table. Left knee was prepped with alcohol swab and utilizing anterolateral approach, patient's left knee space was injected with 4:1  marcaine 0.5%: Kenalog 40mg /dL. Patient tolerated the procedure well without immediate complications.  Procedure: Real-time Ultrasound Guided Injection of right knee Device: GE Logiq Q7 Ultrasound guided injection is preferred based studies that show increased duration, increased effect, greater accuracy, decreased procedural pain, increased response rate, and decreased cost with ultrasound guided versus blind injection.  Verbal informed consent obtained.  Time-out conducted.  Noted no overlying erythema, induration, or other signs of local infection.  Skin prepped in a sterile fashion.  Local anesthesia: Topical Ethyl chloride.  With sterile technique and under real time ultrasound guidance: With a 22-gauge 2 inch needle patient was injected with 4 cc of 0.5% Marcaine and aspiration noted of 60 cc of blood then injected 1 cc of Kenalog 40 mg/dL. This was from a superior lateral approach.  Completed without difficulty  Pain immediately resolved suggesting accurate placement of the medication.  Advised to call if fevers/chills, erythema, induration, drainage, or persistent bleeding.  Images permanently stored and available for review in the ultrasound unit.  Impression: Technically successful ultrasound guided injection.     Impression and Recommendations:     This case required medical decision making of moderate complexity.      Note: This dictation was prepared with Dragon dictation along with smaller phrase technology. Any transcriptional errors that result from this  process are unintentional.

## 2017-12-28 NOTE — Patient Instructions (Addendum)
Good to see you  Diane [name] is your friend.  pennsaid pinkie amount topically 2 times daily as needed.  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

## 2017-12-28 NOTE — Assessment & Plan Note (Signed)
Bilateral knee arthritis.  Patient does have a hemarthrosis of the right knee.  Concern for a PCL injury.  We discussed the increasing instability that this is concerning.  Patient does not want a custom brace at this moment.  Icing regimen, topical anti-inflammatories, and if any worsening pain we will consider pain medications.  X-rays ordered today secondary to the hemarthrosis to rule out any type of fracture that could be contributing.  Follow-up with me again in 2 weeks

## 2018-01-10 NOTE — Progress Notes (Signed)
[name] [place], [place] [address] Phone: [phone] Subjective:     CC: Bilateral knee pain  [id number]  Diane [name] is a 67 y.o. female coming in with complaint of bilateral knee pain.  Found to have severe arthritis of the knees.  Unfortunately had an injury and had a hemarthrosis.  Had a PCL tear likely on the right side and a partial MCL tear on the left side.  Patient states that she has been using the braces which provide some relief. She has had some improvement but is concerned about her right knee. She is unable to extend her right knee completely without sharp pain.      Past Medical History:  Diagnosis Date  . Allergy   . Chicken pox   . GERD (gastroesophageal reflux disease)   . Knee pain   . Seasonal allergic rhinitis   . Vitamin D deficiency    Past Surgical History:  Procedure Laterality Date  . COLONOSCOPY  2009  . [place], 2005  . wisdom teeth exraction     Social History   Socioeconomic History  . Marital status: Married    Spouse name: Not on file  . Number of children: Not on file  . Years of education: Not on file  . Highest education level: Not on file  Social Needs  . Financial resource strain: Not on file  . Food insecurity - worry: Not on file  . Food insecurity - inability: Not on file  . Transportation needs - medical: Not on file  . Transportation needs - non-medical: Not on file  Occupational History  . Not on file  Tobacco Use  . Smoking status: Never Smoker  . Smokeless tobacco: Never Used  Substance and Sexual Activity  . Alcohol use: No  . Drug use: No  . Sexual activity: Yes    Partners: Male  Other Topics Concern  . Not on file  Social History Narrative  . Not on file   No Known Allergies Family History  Problem Relation Age of Onset  . Heart disease Mother   . Heart disease Father   . Colon cancer Neg Hx   . Stomach cancer Neg Hx   . Esophageal cancer  Neg Hx   . Pancreatic cancer Neg Hx   . Rectal cancer Neg Hx      Past medical history, social, surgical and family history all reviewed in electronic medical record.  No pertanent information unless stated regarding to the chief complaint.   Review of Systems:Review of systems updated and as accurate as of 01/10/18  No headache, visual changes, nausea, vomiting, diarrhea, constipation, dizziness, abdominal pain, skin rash, fevers, chills, night sweats, weight loss, swollen lymph nodes, body aches, joint swelling, muscle aches, chest pain, shortness of breath, mood changes.   Objective  There were no vitals taken for this visit. Systems examined below as of 01/10/18   General: No apparent distress alert and oriented x3 mood and affect normal, dressed appropriately.  HEENT: Pupils equal, extraocular movements intact  Respiratory: Patient's speak in full sentences and does not appear short of breath  Cardiovascular: No lower extremity edema, non tender, no erythema  Skin: Warm dry intact with no signs of infection or rash on extremities or on axial skeleton.  Abdomen: Soft nontender  Neuro: Cranial nerves II through XII are intact, neurovascularly intact in all extremities with 2+ DTRs and 2+ pulses.  Lymph: No lymphadenopathy  of posterior or anterior cervical chain or axillae bilaterally.  Gait mild antalgic but improved MSK:  Non tender with full range of motion and good stability and symmetric strength and tone of shoulders, elbows, wrist, hip and ankles bilaterally.  Knee: Right valgus deformity noted.  Abnormal thigh to calf ratio.  Tender to palpation over medial and PF joint line.  Less though than previous exam ROM still lacking the last 7 degrees of extension instability with valgus force.  Patient also has laxity of the PCL painful patellar compression. Patellar glide with moderate crepitus. Patellar and quadriceps tendons unremarkable. Hamstring and quadriceps strength is  normal. Contralateral knee shows mild arthritic changes but no laxity of the MCL that was previously noted.      Impression and Recommendations:     This case required medical decision making of moderate complexity.      Note: This dictation was prepared with Dragon dictation along with smaller phrase technology. Any transcriptional errors that result from this process are unintentional.

## 2018-01-11 ENCOUNTER — Ambulatory Visit: Payer: Self-pay

## 2018-01-11 ENCOUNTER — Encounter: Payer: Self-pay | Admitting: Family Medicine

## 2018-01-11 ENCOUNTER — Ambulatory Visit: Payer: Medicare Other | Admitting: Family Medicine

## 2018-01-11 VITALS — BP 118/80 | HR 70 | Wt 152.0 lb

## 2018-01-11 DIAGNOSIS — M170 Bilateral primary osteoarthritis of knee: Secondary | ICD-10-CM

## 2018-01-11 DIAGNOSIS — M25562 Pain in left knee: Secondary | ICD-10-CM

## 2018-01-11 NOTE — Patient Instructions (Signed)
Good to see you  Try the brace on that knee and see what you think We can always get you a custom brace as well  Discuss with Dr. Maureen Ralphs and see what you think.  We can consider the other injections at any time if you want.  Just tell Ria Comment  I am here for any questions.

## 2018-01-11 NOTE — Assessment & Plan Note (Signed)
Spent  25 minutes with patient face-to-face and had greater than 50% of counseling including as described in assessment and plan.  Discussed with patient at great length about different treatment options.  We did discuss supplementation, custom bracing, as well as icing regimen and possibly formal physical therapy.  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  Likely will give some instability but the underlying problem is the arthritis.  We discussed the possibility of surgical intervention including a knee replacement.  Patient would like a referral to orthopedic surgeon to discuss.  We discussed that any worsening pain we will consider other things.  Follow-up again if considering the Visco supplementation for custom bracing.

## 2018-02-01 ENCOUNTER — Ambulatory Visit: Payer: Medicare Other | Admitting: Family Medicine

## 2018-02-01 ENCOUNTER — Ambulatory Visit: Payer: Self-pay

## 2018-02-01 ENCOUNTER — Encounter: Payer: Self-pay | Admitting: Family Medicine

## 2018-02-01 VITALS — BP 110/88 | HR 80 | Ht 67.0 in | Wt 157.0 lb

## 2018-02-01 DIAGNOSIS — M7552 Bursitis of left shoulder: Secondary | ICD-10-CM

## 2018-02-01 DIAGNOSIS — G8929 Other chronic pain: Secondary | ICD-10-CM | POA: Diagnosis not present

## 2018-02-01 DIAGNOSIS — M25511 Pain in right shoulder: Secondary | ICD-10-CM | POA: Diagnosis not present

## 2018-02-01 DIAGNOSIS — E559 Vitamin D deficiency, unspecified: Secondary | ICD-10-CM

## 2018-02-01 NOTE — Progress Notes (Signed)
[name] [place], [place] [address] Phone: [phone] Subjective:      CC: Left shoulder pain  [id number]  Diane [name] is a 67 y.o. female coming in with complaint of left shoulder pain. Two weeks ago the pain began after picking up branches. The pain started the following day. Most of her pain is in the deltoid muscle. Pain is constant and dull. She has used ice and duexis.  Continues to have pain on a daily basis.  Does not know exactly the weakness.  States that she is unable to move the arm without significant amount of pain.  Rates the severity of pain is 8 out of 10       Past Medical History:  Diagnosis Date  . Allergy   . Chicken pox   . GERD (gastroesophageal reflux disease)   . Knee pain   . Seasonal allergic rhinitis   . Vitamin D deficiency    Past Surgical History:  Procedure Laterality Date  . COLONOSCOPY  2009  . [place], 2005  . wisdom teeth exraction     Social History   Socioeconomic History  . Marital status: Married    Spouse name: Not on file  . Number of children: Not on file  . Years of education: Not on file  . Highest education level: Not on file  Social Needs  . Financial resource strain: Not on file  . Food insecurity - worry: Not on file  . Food insecurity - inability: Not on file  . Transportation needs - medical: Not on file  . Transportation needs - non-medical: Not on file  Occupational History  . Not on file  Tobacco Use  . Smoking status: Never Smoker  . Smokeless tobacco: Never Used  Substance and Sexual Activity  . Alcohol use: No  . Drug use: No  . Sexual activity: Yes    Partners: Male  Other Topics Concern  . Not on file  Social History Narrative  . Not on file   No Known Allergies Family History  Problem Relation Age of Onset  . Heart disease Mother   . Heart disease Father   . Colon cancer Neg Hx   . Stomach cancer Neg Hx   . Esophageal cancer  Neg Hx   . Pancreatic cancer Neg Hx   . Rectal cancer Neg Hx      Past medical history, social, surgical and family history all reviewed in electronic medical record.  No pertanent information unless stated regarding to the chief complaint.   Review of Systems:Review of systems updated and as accurate as of 02/01/18  No headache, visual changes, nausea, vomiting, diarrhea, constipation, dizziness, abdominal pain, skin rash, fevers, chills, night sweats, weight loss, swollen lymph nodes, body aches, joint swelling, chest pain, shortness of breath, mood changes.  Positive muscle aches  Objective  There were no vitals taken for this visit. Systems examined below as of 02/01/18   General: No apparent distress alert and oriented x3 mood and affect normal, dressed appropriately.  HEENT: Pupils equal, extraocular movements intact  Respiratory: Patient's speak in full sentences and does not appear short of breath  Cardiovascular: No lower extremity edema, non tender, no erythema  Skin: Warm dry intact with no signs of infection or rash on extremities or on axial skeleton.  Abdomen: Soft nontender  Neuro: Cranial nerves II through XII are intact, neurovascularly intact in all extremities with 2+ DTRs and  2+ pulses.  Lymph: No lymphadenopathy of posterior or anterior cervical chain or axillae bilaterally.  Gait normal with good balance and coordination.  MSK:  Non tender with full range of motion and good stability and symmetric strength and tone of  elbows, wrist, hip, knee and ankles bilaterally.  Shoulder: left Inspection reveals no abnormalities, atrophy or asymmetry. Palpation is normal with no tenderness over AC joint or bicipital groove. ROM is full in all planes passively. Rotator cuff strength normal throughout. signs of impingement with positive Neer and Hawkin's tests, but negative empty can sign. Speeds and Yergason's tests normal. No labral pathology noted with negative Obrien's,  negative clunk and good stability. Normal scapular function observed. No painful arc and no drop arm sign. No apprehension sign  MSK US performed of: left This study was ordered, performed, and interpreted by Charlann Boxer D.O.  Shoulder:   Supraspinatus:  Appears normal on long and transverse views, Bursal bulge seen with shoulder abduction on impingement view. Infraspinatus:  Appears normal on long and transverse views. Significant increase in Doppler flow Subscapularis:  Appears normal on long and transverse views. Positive bursa but superior to the tendon. Teres Minor:  Appears normal on long and transverse views. AC joint:  Capsule undistended, no geyser sign. Glenohumeral Joint:  Appears normal without effusion. Glenoid Labrum:  Intact without visualized tears. Biceps Tendon:  Appears normal on long and transverse views, no fraying of tendon, tendon located in intertubercular groove, no subluxation with shoulder internal or external rotation.  Impression: Subacromial bursitis, atypical around the subscapularis  Procedure: Real-time Ultrasound Guided Injection of left glenohumeral joint Device: GE Logiq E  Ultrasound guided injection is preferred based studies that show increased duration, increased effect, greater accuracy, decreased procedural pain, increased response rate with ultrasound guided versus blind injection.  Verbal informed consent obtained.  Time-out conducted.  Noted no overlying erythema, induration, or other signs of local infection.  Skin prepped in a sterile fashion.  Local anesthesia: Topical Ethyl chloride.  With sterile technique and under real time ultrasound guidance:  Joint visualized.  23g 1  inch needle inserted anterior approach. Pictures taken for needle placement. Patient did have injection of 2 cc of 1% lidocaine, 2 cc of 0.5% Marcaine, and 1.0 cc of Kenalog 40 mg/dL. Completed without difficulty  Pain immediately resolved suggesting accurate placement  of the medication.  Advised to call if fevers/chills, erythema, induration, drainage, or persistent bleeding.  Images permanently stored and available for review in the ultrasound unit.  Impression: Technically successful ultrasound guided injection.    Impression and Recommendations:     This case required medical decision making of moderate complexity.      Note: This dictation was prepared with Dragon dictation along with smaller phrase technology. Any transcriptional errors that result from this process are unintentional.

## 2018-02-01 NOTE — Patient Instructions (Addendum)
Good to see you  I am sorry we keep meeting you this way  We injected the bursitis.  Ice is your friend.  We will get bone density test as well  I hope it calms it down  See me again in 4 weeks depending on how you feel and what Alusio has decided.

## 2018-02-01 NOTE — Assessment & Plan Note (Signed)
Patient given injection, responded well.  Seem to have a potential nonhealing fracture also in this area.  Possible labral pathology within the differential.  Bone density ordered, home exercises given, discussed icing regimen.  Discussed avoiding heavy lifting for now.  Follow-up again in 4 weeks

## 2018-02-02 ENCOUNTER — Other Ambulatory Visit: Payer: Self-pay | Admitting: Family Medicine

## 2018-02-02 NOTE — Telephone Encounter (Signed)
Refill done.  

## 2018-02-04 ENCOUNTER — Ambulatory Visit (INDEPENDENT_AMBULATORY_CARE_PROVIDER_SITE_OTHER)
Admission: RE | Admit: 2018-02-04 | Discharge: 2018-02-04 | Disposition: A | Discharge status: Home / Self Care | Payer: Medicare Other | Source: Ambulatory Visit

## 2018-02-04 DIAGNOSIS — M899 Disorder of bone, unspecified: Secondary | ICD-10-CM | POA: Diagnosis not present

## 2018-02-04 DIAGNOSIS — E559 Vitamin D deficiency, unspecified: Secondary | ICD-10-CM

## 2018-03-01 NOTE — Progress Notes (Signed)
[name] [name], [name] Phone: [phone] Subjective:    I'm seeing this patient by the request  of:    CC: knee pain follow up   [id number]  Diane [name] is a 67 y.o. female coming in with complaint of bilateral knee pain.  Found to have arthritis.  Considering the possibility of surgical intervention.  Patient also had a left shoulder bursitis.  Seem to be atypical.  Given injection February 01, 2018.  Patient states 100% better at this time.  Felt better the day after the injection even.  Not having any significant pain at this point and no weakness.  Patient denies any numbness or tingling.   Patient did have a bone density exam showing patient having very mild osteopenia.  Past Medical History:  Diagnosis Date  . Allergy   . Chicken pox   . GERD (gastroesophageal reflux disease)   . Knee pain   . Seasonal allergic rhinitis   . Vitamin D deficiency    Past Surgical History:  Procedure Laterality Date  . COLONOSCOPY  2009  . [place], 2005  . wisdom teeth exraction     Social History   Socioeconomic History  . Marital status: Married    Spouse name: None  . Number of children: None  . Years of education: None  . Highest education level: None  Social Needs  . Financial resource strain: None  . Food insecurity - worry: None  . Food insecurity - inability: None  . Transportation needs - medical: None  . Transportation needs - non-medical: None  Occupational History  . None  Tobacco Use  . Smoking status: Never Smoker  . Smokeless tobacco: Never Used  Substance and Sexual Activity  . Alcohol use: No  . Drug use: No  . Sexual activity: Yes    Partners: Male  Other Topics Concern  . None  Social History Narrative  . None   No Known Allergies Family History  Problem Relation Age of Onset  . Heart disease Mother   . Heart disease Father   . Colon cancer Neg Hx   . Stomach cancer  Neg Hx   . Esophageal cancer Neg Hx   . Pancreatic cancer Neg Hx   . Rectal cancer Neg Hx      Past medical history, social, surgical and family history all reviewed in electronic medical record.  No pertanent information unless stated regarding to the chief complaint.   Review of Systems:Review of systems updated and as accurate as of 03/02/18  No headache, visual changes, nausea, vomiting, diarrhea, constipation, dizziness, abdominal pain, skin rash, fevers, chills, night sweats, weight loss, swollen lymph nodes, body aches, joint swelling, muscle aches, chest pain, shortness of breath, mood changes.   Objective  Blood pressure 116/80, pulse 74, height 5\' 7"  (1.702 m), weight 155 lb (70.3 kg), SpO2 97 %. Systems examined below as of 03/02/18   General: No apparent distress alert and oriented x3 mood and affect normal, dressed appropriately.  HEENT: Pupils equal, extraocular movements intact  Respiratory: Patient's speak in full sentences and does not appear short of breath  Cardiovascular: No lower extremity edema, non tender, no erythema  Skin: Warm dry intact with no signs of infection or rash on extremities or on axial skeleton.  Abdomen: Soft nontender  Neuro: Cranial nerves II through XII are intact, neurovascularly intact in all extremities with 2+ DTRs and 2+ pulses.  Lymph:  No lymphadenopathy of posterior or anterior cervical chain or axillae bilaterally.  Gait normal with good balance and coordination.  MSK:  Non tender with full range of motion and good stability and symmetric strength and tone of elbows, wrist, hip, and ankles bilaterally.  Shoulder: Inspection reveals no abnormalities, atrophy or asymmetry. Palpation is normal with no tenderness over AC joint or bicipital groove. ROM is full in all planes. Rotator cuff strength normal throughout. No signs of impingement with negative Neer and Hawkin's tests, empty can sign. Speeds and Yergason's tests normal. No labral  pathology noted with negative Obrien's, negative clunk and good stability. Normal scapular function observed. No painful arc and no drop arm sign. No apprehension sign Contralateral shoulder unremarkable  Knee: Bilateral valgus deformity noted.  Abnormal thigh to calf ratio.  Tender to palpation over medial and PF joint line.  ROM full in flexion and extension and lower leg rotation. instability with valgus force.  painful patellar compression. Patellar glide with moderate crepitus. Patellar and quadriceps tendons unremarkable. Hamstring and quadriceps strength is normal.      Impression and Recommendations:     This case required medical decision making of moderate complexity.      Note: This dictation was prepared with Dragon dictation along with smaller phrase technology. Any transcriptional errors that result from this process are unintentional.

## 2018-03-02 ENCOUNTER — Encounter: Payer: Self-pay | Admitting: Family Medicine

## 2018-03-02 ENCOUNTER — Ambulatory Visit: Payer: Medicare Other | Admitting: Family Medicine

## 2018-03-02 DIAGNOSIS — M170 Bilateral primary osteoarthritis of knee: Secondary | ICD-10-CM

## 2018-03-02 DIAGNOSIS — M7552 Bursitis of left shoulder: Secondary | ICD-10-CM | POA: Diagnosis not present

## 2018-03-02 NOTE — Assessment & Plan Note (Signed)
Patient at the moment will continue with conservative therapy.  Will consider injections if needed.  Will consider Visco supplementation.  Follow-up again in 4 weeks

## 2018-03-02 NOTE — Assessment & Plan Note (Signed)
Significant improvement with conservative therapy.  Discussed ergonomics and proper lifting conditions.  Follow-up as needed

## 2018-04-26 ENCOUNTER — Other Ambulatory Visit: Payer: Self-pay | Admitting: *Deleted

## 2018-04-26 MED ORDER — VITAMIN D (ERGOCALCIFEROL) 1.25 MG (50000 UNIT) PO CAPS: 50000.0000 [IU] | ORAL_CAPSULE | ORAL | 0 refills | Status: DC

## 2018-04-26 NOTE — Telephone Encounter (Signed)
Refill done.  

## 2018-06-22 NOTE — Progress Notes (Signed)
[name] [address] [place], [place] [address] Phone: [phone] Subjective:     CC: Right knee pain  AQT:[id number]  Diane [name] is a 67 y.o. female coming in with complaint of right knee pain.  Has severe arthritis of the knee.  Does not want a hip replacement.  Last injection was back in February and was doing quite better.  Started having worsening discomfort again.  Has had some benefit with Visco supplementation in June 2018.  Having some intermittent swelling and having increasing instability      Past Medical History:  Diagnosis Date  . Allergy   . Chicken pox   . GERD (gastroesophageal reflux disease)   . Knee pain   . Seasonal allergic rhinitis   . Vitamin D deficiency    Past Surgical History:  Procedure Laterality Date  . COLONOSCOPY  2009  . [place], 2005  . wisdom teeth exraction     Social History   Socioeconomic History  . Marital status: Married    Spouse name: Not on file  . Number of children: Not on file  . Years of education: Not on file  . Highest education level: Not on file  Occupational History  . Not on file  Social Needs  . Financial resource strain: Not on file  . Food insecurity:    Worry: Not on file    Inability: Not on file  . Transportation needs:    Medical: Not on file    Non-medical: Not on file  Tobacco Use  . Smoking status: Never Smoker  . Smokeless tobacco: Never Used  Substance and Sexual Activity  . Alcohol use: No  . Drug use: No  . Sexual activity: Yes    Partners: Male  Lifestyle  . Physical activity:    Days per week: Not on file    Minutes per session: Not on file  . Stress: Not on file  Relationships  . Social connections:    Talks on phone: Not on file    Gets together: Not on file    Attends religious service: Not on file    Active member of club or organization: Not on file    Attends meetings of clubs or organizations: Not on file    Relationship  status: Not on file  Other Topics Concern  . Not on file  Social History Narrative  . Not on file   No Known Allergies Family History  Problem Relation Age of Onset  . Heart disease Mother   . Heart disease Father   . Colon cancer Neg Hx   . Stomach cancer Neg Hx   . Esophageal cancer Neg Hx   . Pancreatic cancer Neg Hx   . Rectal cancer Neg Hx      Past medical history, social, surgical and family history all reviewed in electronic medical record.  No pertanent information unless stated regarding to the chief complaint.   Review of Systems:Review of systems updated and as accurate as of 06/24/18  No headache, visual changes, nausea, vomiting, diarrhea, constipation, dizziness, abdominal pain, skin rash, fevers, chills, night sweats, weight loss, swollen lymph nodes, body aches, , chest pain, shortness of breath, mood changes.  Positive muscle aches and joint swelling  Objective  Blood pressure 120/74, pulse 80, height 5\' 7"  (1.702 m), weight 157 lb (71.2 kg), SpO2 98 %. Systems examined below as of 06/24/18   General: No apparent distress alert and oriented  x3 mood and affect normal, dressed appropriately.  HEENT: Pupils equal, extraocular movements intact  Respiratory: Patient's speak in full sentences and does not appear short of breath  Cardiovascular: No lower extremity edema, non tender, no erythema  Skin: Warm dry intact with no signs of infection or rash on extremities or on axial skeleton.  Abdomen: Soft nontender  Neuro: Cranial nerves II through XII are intact, neurovascularly intact in all extremities with 2+ DTRs and 2+ pulses.  Lymph: No lymphadenopathy of posterior or anterior cervical chain or axillae bilaterally.  Gait antalgic gait MSK:  Non tender with full range of motion and good stability and symmetric strength and tone of shoulders, elbows, wrist, hip, and ankles bilaterally.  Knee: Right valgus deformity noted.  Abnormal thigh to calf ratio.  Tender to  palpation over medial and PF joint line.  ROM full in flexion and extension and lower leg rotation. instability with valgus force.  painful patellar compression. Patellar glide with moderate crepitus. Patellar and quadriceps tendons unremarkable. Hamstring and quadriceps strength is normal. Contralateral knee shows moderate arthritic changes as well  After informed written and verbal consent, patient was seated on exam table. Right knee was prepped with alcohol swab and utilizing anterolateral approach, patient's right knee space was injected with 4:1  marcaine 0.5%: Kenalog 40mg /dL. Patient tolerated the procedure well without immediate complications.    Impression and Recommendations:     This case required medical decision making of moderate complexity.      Note: This dictation was prepared with Dragon dictation along with smaller phrase technology. Any transcriptional errors that result from this process are unintentional.

## 2018-06-24 ENCOUNTER — Ambulatory Visit: Payer: Medicare Other | Admitting: Family Medicine

## 2018-06-24 ENCOUNTER — Encounter: Payer: Self-pay | Admitting: Family Medicine

## 2018-06-24 DIAGNOSIS — M170 Bilateral primary osteoarthritis of knee: Secondary | ICD-10-CM | POA: Diagnosis not present

## 2018-06-24 NOTE — Patient Instructions (Signed)
Good to see you  Injected the knee We will get you approved for the other injections.  I hope this helps a long time pennsaid pinkie amount topically 2 times daily as needed.  Wear brace if it helps See me again in 4 weeks if you feel we need the injections otherwise see me again in 3 months

## 2018-06-24 NOTE — Assessment & Plan Note (Signed)
Patient given injection.  Tolerated procedure well.  We discussed icing regimen and home exercises.  We discussed which activities doing which wants to avoid.  Patient was to increase activity as tolerated.  Patient will follow-up with me again in 4 to 8 weeks could be candidate to repeat Visco supplementation

## 2018-07-15 ENCOUNTER — Ambulatory Visit: Payer: Medicare Other | Admitting: Family Medicine

## 2018-07-15 ENCOUNTER — Telehealth: Payer: Self-pay | Admitting: Internal Medicine

## 2018-07-15 ENCOUNTER — Encounter: Payer: Self-pay | Admitting: Family Medicine

## 2018-07-15 ENCOUNTER — Ambulatory Visit: Payer: Self-pay

## 2018-07-15 VITALS — BP 128/74 | HR 94 | Ht 67.0 in | Wt 155.0 lb

## 2018-07-15 DIAGNOSIS — L03113 Cellulitis of right upper limb: Secondary | ICD-10-CM | POA: Insufficient documentation

## 2018-07-15 DIAGNOSIS — M25521 Pain in right elbow: Secondary | ICD-10-CM

## 2018-07-15 DIAGNOSIS — Z23 Encounter for immunization: Secondary | ICD-10-CM | POA: Diagnosis not present

## 2018-07-15 MED ORDER — DOXYCYCLINE HYCLATE 100 MG PO TABS: 100.0000 mg | ORAL_TABLET | Freq: Two times a day (BID) | ORAL | 0 refills | Status: DC

## 2018-07-15 MED ORDER — TETANUS-DIPHTH-ACELL PERTUSSIS 5-2.5-18.5 LF-MCG/0.5 IM SUSP
0.5000 mL | Freq: Once | INTRAMUSCULAR | Status: AC
  Administered 2018-07-15: 0.5 mL via INTRAMUSCULAR

## 2018-07-15 MED ORDER — CEFTRIAXONE SODIUM 1 G IJ SOLR
1.0000 g | Freq: Once | INTRAMUSCULAR | Status: AC
  Administered 2018-07-15: 1 g via INTRAMUSCULAR

## 2018-07-15 MED ORDER — TRAMADOL HCL 50 MG PO TABS: 50.0000 mg | ORAL_TABLET | Freq: Two times a day (BID) | ORAL | 0 refills | Status: DC | PRN

## 2018-07-15 NOTE — Assessment & Plan Note (Signed)
Cellulitis noted.  We discussed icing regimen, compression, and doxycycline given and ceftriaxone injection given today.  Patient likely had more of a spider bite.  Discussed which activities of doing which wants to avoid.  Follow-up again in 4 weeks

## 2018-07-15 NOTE — Progress Notes (Signed)
[name] [place] [place], [place] [address] Phone: [phone] Subjective:     CC: Right elbow pain  KCL:[id number]  Diane [name] is a 67 y.o. female coming in with complaint of right elbow pain. Elbow is swollen. States she felt a pain cleaning. Has a bump on the elbow. States she thought it was an insect bite. TTP. Warm to the touch. Red discoloration. No open scratches in the area.   Onset- last Saturday afternoon Location- Elbow  Character- throbbing Aggravating factors- extension Reliving factors-not putting pressure on it Therapies tried- [employer] out of 10.     Past Medical History:  Diagnosis Date  . Allergy   . Chicken pox   . GERD (gastroesophageal reflux disease)   . Knee pain   . Seasonal allergic rhinitis   . Vitamin D deficiency    Past Surgical History:  Procedure Laterality Date  . COLONOSCOPY  2009  . [place], 2005  . wisdom teeth exraction     Social History   Socioeconomic History  . Marital status: Married    Spouse name: Not on file  . Number of children: Not on file  . Years of education: Not on file  . Highest education level: Not on file  Occupational History  . Not on file  Social Needs  . Financial resource strain: Not on file  . Food insecurity:    Worry: Not on file    Inability: Not on file  . Transportation needs:    Medical: Not on file    Non-medical: Not on file  Tobacco Use  . Smoking status: Never Smoker  . Smokeless tobacco: Never Used  Substance and Sexual Activity  . Alcohol use: No  . Drug use: No  . Sexual activity: Yes    Partners: Male  Lifestyle  . Physical activity:    Days per week: Not on file    Minutes per session: Not on file  . Stress: Not on file  Relationships  . Social connections:    Talks on phone: Not on file    Gets together: Not on file    Attends religious service: Not on file    Active member of club or organization: Not on file      Attends meetings of clubs or organizations: Not on file    Relationship status: Not on file  Other Topics Concern  . Not on file  Social History Narrative  . Not on file   No Known Allergies Family History  Problem Relation Age of Onset  . Heart disease Mother   . Heart disease Father   . Colon cancer Neg Hx   . Stomach cancer Neg Hx   . Esophageal cancer Neg Hx   . Pancreatic cancer Neg Hx   . Rectal cancer Neg Hx      Past medical history, social, surgical and family history all reviewed in electronic medical record.  No pertanent information unless stated regarding to the chief complaint.   Review of Systems:Review of systems updated and as accurate as of 07/15/18  No headache, visual changes, nausea, vomiting, diarrhea, constipation, dizziness, abdominal pain, skin rash, fevers, chills, night sweats, weight loss, swollen lymph nodes, body aches, joint swelling, muscle aches, chest pain, shortness of breath, mood changes.   Objective  Blood pressure 128/74, pulse 94, height 5\' 7"  (1.702 m), weight 155 lb (70.3 kg), SpO2 97 %. Systems examined below as of 07/15/18  General: No apparent distress alert and oriented x3 mood and affect normal, dressed appropriately.  HEENT: Pupils equal, extraocular movements intact  Respiratory: Patient's speak in full sentences and does not appear short of breath  Cardiovascular: No lower extremity edema, non tender, no erythema  Skin: Warm dry intact with no signs of infection or rash on extremities or on axial skeleton.  Abdomen: Soft nontender  Neuro: Cranial nerves II through XII are intact, neurovascularly intact in all extremities with 2+ DTRs and 2+ pulses.  Lymph: No lymphadenopathy of posterior or anterior cervical chain or axillae bilaterally.  Gait normal with good balance and coordination.  MSK:  Non tender with full range of motion and good stability and symmetric strength and tone of shoulders,  wrist, hip, knee and ankles  bilaterally.  Elbow: Right Significant swelling.  Redness noted over the olecranon Mild loss of range of motion lacking last 5 degrees of flexion Stable to varus, valgus stress. Negative moving valgus stress test. Severely tender to palpation over the olecranon area where the swelling.  Significantly warm to touch. Ulnar nerve does not sublux. Negative cubital tunnel Tinel's. Contralateral elbow unremarkable  Limited muscular skeletal ultrasound was performed and interpreted by Lyndal Pulley  Limited ultrasound of the right elbow shows significant soft tissue swelling with significant increasing Doppler flow in the pacing consistent with a cellulitis.  No foreign body is noted Impression: No acute cellulitis of the soft tissue on the olecranon aspect.   Impression and Recommendations:     This case required medical decision making of moderate complexity.      Note: This dictation was prepared with Dragon dictation along with smaller phrase technology. Any transcriptional errors that result from this process are unintentional.

## 2018-07-15 NOTE — Patient Instructions (Addendum)
Good to see you  I am sorry but looks infected.  We will do shot of antibiotics today  Doxycycline 2 times day for 10 days  I will see you again Monday or Tuesday (ok to double book) Tramadol up to 2 times a day if in severe pain  If redness worsens or streaking occurs please go to the emergency room.

## 2018-07-15 NOTE — Telephone Encounter (Signed)
Copied from [place] [id number]. Topic: Quick Communication - Rx Refill/Question >> Jul 15, 2018  3:49 PM Oliver Pila B wrote: Medication: doxycycline (VIBRA-TABS) 100 MG tablet [[id number]]   Pharmacy called to get clarification on quantity and directions  Agent: Please be advised that RX refills may take up to 3 business days. We ask that you follow-up with your pharmacy.

## 2018-07-15 NOTE — Telephone Encounter (Signed)
Already filled at 3:43pm today.

## 2018-07-16 NOTE — Progress Notes (Signed)
[name] [address] [place], [place] [address] Phone: [phone] Subjective:    I'm seeing this patient by the request  of:    CC: Elbow pain follow-up  UXL:[id number]  Diane [name] is a 67 y.o. female coming in with complaint of elbow pain. Elbow is doing better today. Swelling and redness has gone down. Bump on elbow that was noted during the last visit now has puss at the surface.   Patient was found to have more of a cellulitis.  Started on doxycycline and does think it is improving.  Denies any fevers or chills.     Past Medical History:  Diagnosis Date  . Allergy   . Chicken pox   . GERD (gastroesophageal reflux disease)   . Knee pain   . Seasonal allergic rhinitis   . Vitamin D deficiency    Past Surgical History:  Procedure Laterality Date  . COLONOSCOPY  2009  . [place], 2005  . wisdom teeth exraction     Social History   Socioeconomic History  . Marital status: Married    Spouse name: Not on file  . Number of children: Not on file  . Years of education: Not on file  . Highest education level: Not on file  Occupational History  . Not on file  Social Needs  . Financial resource strain: Not on file  . Food insecurity:    Worry: Not on file    Inability: Not on file  . Transportation needs:    Medical: Not on file    Non-medical: Not on file  Tobacco Use  . Smoking status: Never Smoker  . Smokeless tobacco: Never Used  Substance and Sexual Activity  . Alcohol use: No  . Drug use: No  . Sexual activity: Yes    Partners: Male  Lifestyle  . Physical activity:    Days per week: Not on file    Minutes per session: Not on file  . Stress: Not on file  Relationships  . Social connections:    Talks on phone: Not on file    Gets together: Not on file    Attends religious service: Not on file    Active member of club or organization: Not on file    Attends meetings of clubs or organizations: Not on file      Relationship status: Not on file  Other Topics Concern  . Not on file  Social History Narrative  . Not on file   No Known Allergies Family History  Problem Relation Age of Onset  . Heart disease Mother   . Heart disease Father   . Colon cancer Neg Hx   . Stomach cancer Neg Hx   . Esophageal cancer Neg Hx   . Pancreatic cancer Neg Hx   . Rectal cancer Neg Hx      Past medical history, social, surgical and family history all reviewed in electronic medical record.  No pertanent information unless stated regarding to the chief complaint.   Review of Systems:Review of systems updated and as accurate as of 07/19/18  No headache, visual changes, nausea, vomiting, diarrhea, constipation, dizziness, abdominal pain, skin rash, fevers, chills, night sweats, weight loss, swollen lymph nodes, body aches, j, muscle aches, chest pain, shortness of breath, mood changes.  Positive joint swelling  Objective  Blood pressure 120/74, pulse 71, height 5\' 7"  (1.702 m), weight 154 lb (69.9 kg), SpO2 98 %. Systems examined below as  of 07/19/18   General: No apparent distress alert and oriented x3 mood and affect normal, dressed appropriately.  HEENT: Pupils equal, extraocular movements intact  Respiratory: Patient's speak in full sentences and does not appear short of breath  Cardiovascular: No lower extremity edema, non tender, no erythema  Skin: Warm dry intact with no signs of infection or rash on extremities or on axial skeleton.  Abdomen: Soft nontender  Neuro: Cranial nerves II through XII are intact, neurovascularly intact in all extremities with 2+ DTRs and 2+ pulses.  Lymph: No lymphadenopathy of posterior or anterior cervical chain or axillae bilaterally.  Gait normal with good balance and coordination.  MSK:  Non tender with full range of motion and good stability and symmetric strength and tone of shoulders, , wrist, hip, knee and ankles bilaterally.  Arthritic changes of the knees  noted Right elbow does show some swelling noted.  Patient has significant decrease in redness from previous exam over the olecranon area.  Mild warmth to touch but significantly better.  Near full range of motion lacking the last 2 degrees of flexion  Limited musculoskeletal ultrasound was performed and interpreted by Lyndal Pulley  Limited ultrasound shows that patient still has hypoechoic changes in swelling with increasing Doppler flow of the soft tissue.  Still appears that this does not affect the bone and at this time.  Joint does show some very small effusion noted. Impression: Improvement in soft tissue changes and cellulitis from previous exam but still not back to normal    Impression and Recommendations:     This case required medical decision making of moderate complexity.      Note: This dictation was prepared with Dragon dictation along with smaller phrase technology. Any transcriptional errors that result from this process are unintentional.

## 2018-07-19 ENCOUNTER — Encounter: Payer: Self-pay | Admitting: Family Medicine

## 2018-07-19 ENCOUNTER — Ambulatory Visit: Payer: Medicare Other | Admitting: Family Medicine

## 2018-07-19 ENCOUNTER — Telehealth: Payer: Self-pay | Admitting: Family Medicine

## 2018-07-19 ENCOUNTER — Ambulatory Visit: Payer: Self-pay

## 2018-07-19 VITALS — BP 120/74 | HR 71 | Ht 67.0 in | Wt 154.0 lb

## 2018-07-19 DIAGNOSIS — M25521 Pain in right elbow: Secondary | ICD-10-CM

## 2018-07-19 DIAGNOSIS — L03113 Cellulitis of right upper limb: Secondary | ICD-10-CM | POA: Diagnosis not present

## 2018-07-19 MED ORDER — DOXYCYCLINE HYCLATE 100 MG PO TABS: 100.0000 mg | ORAL_TABLET | Freq: Two times a day (BID) | ORAL | 0 refills | Status: AC

## 2018-07-19 NOTE — Assessment & Plan Note (Signed)
Extend doxycycline to a total of 20 days.  Warned of potential side effects.  Wound and possible aspiration may be necessary but I do not think so.  Did not see any true abscess formation.  Follow-up again 2 weeks

## 2018-07-19 NOTE — Telephone Encounter (Signed)
Per a verbal from Dr. Tamala Julian patient is allowed to take 2 pills for first 10 days and then take 1 pill for the next 10 days. Patient voices understanding.

## 2018-07-19 NOTE — Patient Instructions (Addendum)
Good to see you  Alvera Singh is your friend.  Doxy another 10 days See me again in 2 weeks (ok to double book f needed)

## 2018-07-19 NOTE — Telephone Encounter (Signed)
Copied from [place] [id number]. Topic: Quick Communication - See Telephone Encounter >> Jul 19, 2018  3:14 PM Berneta Levins wrote: CRM for notification. See Telephone encounter for: 07/19/18. Pt calling requesting clarification on doxycycline (VIBRA-TABS) 100 MG tablet - RX from pharmacy states 7 days, but print out given at visit states to take for an additional 10 days.  Pt can be reached at [phone]

## 2018-07-20 ENCOUNTER — Telehealth: Payer: Self-pay

## 2018-07-20 NOTE — Telephone Encounter (Signed)
Copied from [place] [id number]. Topic: General - Other >> Jul 20, 2018  8:05 AM Keene Breath wrote: Reason for CRM: Patient called to inform doctor Tamala Julian that the boil she has on her arm from spider bite popped last night and the skin around it is dead.  Patient said doctor tried to pop it at her visit yesterday 07/19/18 but it didn't pop.  Patient would like to know what she should do now.  Does he want to see her again or what can she put on the area.  Please advise.  CB# [id number].

## 2018-07-20 NOTE — Telephone Encounter (Signed)
Per a verbal from Dr. Tamala Julian, he looked a picture that patient sent in and patient needs to use neosporin for 2 days and then can discontinue and leave uncovered after that. Patient voices understanding.

## 2018-07-29 NOTE — Progress Notes (Signed)
[name] [address] [place], [place] [address] Phone: [phone] Subjective:    I'm seeing this patient by the request  of:    CC: Left elbow pain follow-up  [id number]  Diane [name] is a 67 y.o. female coming in with complaint of left elbow pain. She said that the scab was white and she hit her elbow this weekend which caused the area to bleed a lot. She covers that area with a bandaid now to prevent the scab from coming off. Continues to take medication for infection but would like to discontinue it.  States 99% better no fevers or chills       Past Medical History:  Diagnosis Date  . Allergy   . Chicken pox   . GERD (gastroesophageal reflux disease)   . Knee pain   . Seasonal allergic rhinitis   . Vitamin D deficiency    Past Surgical History:  Procedure Laterality Date  . COLONOSCOPY  2009  . [place], 2005  . wisdom teeth exraction     Social History   Socioeconomic History  . Marital status: Married    Spouse name: Not on file  . Number of children: Not on file  . Years of education: Not on file  . Highest education level: Not on file  Occupational History  . Not on file  Social Needs  . Financial resource strain: Not on file  . Food insecurity:    Worry: Not on file    Inability: Not on file  . Transportation needs:    Medical: Not on file    Non-medical: Not on file  Tobacco Use  . Smoking status: Never Smoker  . Smokeless tobacco: Never Used  Substance and Sexual Activity  . Alcohol use: No  . Drug use: No  . Sexual activity: Yes    Partners: Male  Lifestyle  . Physical activity:    Days per week: Not on file    Minutes per session: Not on file  . Stress: Not on file  Relationships  . Social connections:    Talks on phone: Not on file    Gets together: Not on file    Attends religious service: Not on file    Active member of club or organization: Not on file    Attends meetings of clubs  or organizations: Not on file    Relationship status: Not on file  Other Topics Concern  . Not on file  Social History Narrative  . Not on file   No Known Allergies Family History  Problem Relation Age of Onset  . Heart disease Mother   . Heart disease Father   . Colon cancer Neg Hx   . Stomach cancer Neg Hx   . Esophageal cancer Neg Hx   . Pancreatic cancer Neg Hx   . Rectal cancer Neg Hx      Past medical history, social, surgical and family history all reviewed in electronic medical record.  No pertanent information unless stated regarding to the chief complaint.   Review of Systems:Review of systems updated and as accurate as of 08/02/18  No headache, visual changes, nausea, vomiting, diarrhea, constipation, dizziness, abdominal pain, skin rash, fevers, chills, night sweats, weight loss, swollen lymph nodes, body aches, joint swelling, muscle aches, chest pain, shortness of breath, mood changes.   Objective  Blood pressure 122/80, pulse 73, height 5\' 7"  (1.702 m), weight 150 lb (68 kg), SpO2 98 %.  Systems examined below as of 08/02/18   General: No apparent distress alert and oriented x3 mood and affect normal, dressed appropriately.  HEENT: Pupils equal, extraocular movements intact  Respiratory: Patient's speak in full sentences and does not appear short of breath  Cardiovascular: No lower extremity edema, non tender, no erythema  Skin: Warm dry intact with no signs of infection or rash on extremities or on axial skeleton.  Abdomen: Soft nontender  Neuro: Cranial nerves II through XII are intact, neurovascularly intact in all extremities with 2+ DTRs and 2+ pulses.  Lymph: No lymphadenopathy of posterior or anterior cervical chain or axillae bilaterally.  Gait normal with good balance and coordination.  MSK:  Non tender with full range of motion and good stability and symmetric strength and tone of shoulders,  wrist, hip, knee and ankles bilaterally.  Elbow: Right Hand  is healing sore noted with some good granulation tissue but no significant discharge. Range of motion full pronation, supination, flexion, extension. Strength is full to all of the above directions Stable to varus, valgus stress. Negative moving valgus stress test. No discrete areas of tenderness to palpation. Ulnar nerve does not sublux. Negative cubital tunnel Tinel's. Contralateral elbow unremarkable   Impression and Recommendations:     This case required medical decision making of moderate complexity.      Note: This dictation was prepared with Dragon dictation along with smaller phrase technology. Any transcriptional errors that result from this process are unintentional.

## 2018-08-02 ENCOUNTER — Ambulatory Visit: Payer: Medicare Other | Admitting: Family Medicine

## 2018-08-02 DIAGNOSIS — L03113 Cellulitis of right upper limb: Secondary | ICD-10-CM | POA: Diagnosis not present

## 2018-08-02 NOTE — Assessment & Plan Note (Signed)
Resolved at this time.  Discussed icing regimen and home exercises.  Discussed which activities to do which wants to avoid.  Increase activity as tolerated.

## 2018-10-18 ENCOUNTER — Encounter: Payer: Self-pay | Admitting: Family Medicine

## 2018-10-18 ENCOUNTER — Ambulatory Visit: Payer: Medicare Other | Admitting: Family Medicine

## 2018-10-18 DIAGNOSIS — M170 Bilateral primary osteoarthritis of knee: Secondary | ICD-10-CM

## 2018-10-18 DIAGNOSIS — M7552 Bursitis of left shoulder: Secondary | ICD-10-CM

## 2018-10-18 MED ORDER — VITAMIN D (ERGOCALCIFEROL) 1.25 MG (50000 UNIT) PO CAPS: 50000.0000 [IU] | ORAL_CAPSULE | ORAL | 0 refills | Status: DC

## 2018-10-18 NOTE — Assessment & Plan Note (Signed)
Patient given another injection.  Right side only.  Discussed icing regimen and home exercise.  Which activities to do which wants to avoid.  Discussed icing regimen and home exercises.

## 2018-10-18 NOTE — Progress Notes (Signed)
[name] [address] [place], [place] [address] Phone: [phone] Subjective:    I [name] am serving as a Education administrator for Dr. Hulan Saas.    CC: Left shoulder and right knee pain  [id number]  ARILYNN BLAKENEY is a 67 y.o. female coming in with complaint of right knee and left shoulder pain. Believes she had bursitis of the left shoulder. Injured it doing exercise on a stationary bike. States that it feels like her knee cap is hurting/moving.    Left shoulder has had bursitis previously.  Last injection was a month ago.  Describes the pain as a dull, throbbing aching pain and waking her up at night.  Was using a type of hand bicycle but seemed to exacerbate at this time.  No weakness.  Right knee pain.  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  Only the right knee seems to be giving pain.  Wants to hold off any type of replacement at this time.  Having difficulty with daily activities as well as taking stairs      Past Medical History:  Diagnosis Date  . Allergy   . Chicken pox   . GERD (gastroesophageal reflux disease)   . Knee pain   . Seasonal allergic rhinitis   . Vitamin D deficiency    Past Surgical History:  Procedure Laterality Date  . COLONOSCOPY  2009  . [place], 2005  . wisdom teeth exraction     Social History   Socioeconomic History  . Marital status: Married    Spouse name: Not on file  . Number of children: Not on file  . Years of education: Not on file  . Highest education level: Not on file  Occupational History  . Not on file  Social Needs  . Financial resource strain: Not on file  . Food insecurity:    Worry: Not on file    Inability: Not on file  . Transportation needs:    Medical: Not on file    Non-medical: Not on file  Tobacco Use  . Smoking status: Never Smoker  . Smokeless tobacco: Never Used  Substance and Sexual Activity  . Alcohol use: No  . Drug use: No  . Sexual activity:  Yes    Partners: Male  Lifestyle  . Physical activity:    Days per week: Not on file    Minutes per session: Not on file  . Stress: Not on file  Relationships  . Social connections:    Talks on phone: Not on file    Gets together: Not on file    Attends religious service: Not on file    Active member of club or organization: Not on file    Attends meetings of clubs or organizations: Not on file    Relationship status: Not on file  Other Topics Concern  . Not on file  Social History Narrative  . Not on file   No Known Allergies Family History  Problem Relation Age of Onset  . Heart disease Mother   . Heart disease Father   . Colon cancer Neg Hx   . Stomach cancer Neg Hx   . Esophageal cancer Neg Hx   . Pancreatic cancer Neg Hx   . Rectal cancer Neg Hx         Current Outpatient Medications (Respiratory):  .  fluticasone (FLONASE) 50 MCG/ACT nasal spray, Place 2 sprays into both nostrils daily. (Patient taking differently: Place  2 sprays into both nostrils daily as needed. )   Current Outpatient Medications (Analgesics):  .  traMADol (ULTRAM) 50 MG tablet, Take 1 tablet (50 mg total) by mouth every 12 (twelve) hours as needed for severe pain.     Current Outpatient Medications (Other):  Marland Kitchen  Ascorbic Acid (VITAMIN C GUMMIES PO), Take 1 tablet by mouth daily. .  Biotin 1000 MCG tablet, Take 1,000 mcg by mouth daily. .  Cholecalciferol (VITAMIN D PO), Take by mouth. .  Diclofenac Sodium (PENNSAID) 2 % SOLN, Place 2 application onto the skin 2 (two) times daily. Marland Kitchen  glucosamine-chondroitin 500-400 MG tablet, Take 1 tablet by mouth daily. .  Multiple Vitamin (MULTIVITAMIN) tablet, Take 1 tablet by mouth daily. .  ranitidine (ZANTAC) 150 MG tablet, Take 1 tablet (150 mg total) by mouth 2 (two) times daily. Take 1-2 tablets daily as needed .  Vitamin D, Ergocalciferol, (DRISDOL) 50000 units CAPS capsule, Take 1 capsule (50,000 Units total) by mouth once a week.  Current  Facility-Administered Medications (Other):  .  gi cocktail (Maalox,Lidocaine,Donnatal)* * These medications belong to multiple therapeutic classes and are listed under each applicable group.    Past medical history, social, surgical and family history all reviewed in electronic medical record.  No pertanent information unless stated regarding to the chief complaint.   Review of Systems:  No headache, visual changes, nausea, vomiting, diarrhea, constipation, dizziness, abdominal pain, skin rash, fevers, chills, night sweats, weight loss, swollen lymph nodes, body aches, joint swelling,  chest pain, shortness of breath, mood changes.  Positive muscle aches  Objective  Blood pressure 124/80, pulse 83, height 5\' 7"  (1.702 m), weight 158 lb (71.7 kg), SpO2 97 %.    General: No apparent distress alert and oriented x3 mood and affect normal, dressed appropriately.  HEENT: Pupils equal, extraocular movements intact  Respiratory: Patient's speak in full sentences and does not appear short of breath  Cardiovascular: No lower extremity edema, non tender, no erythema  Skin: Warm dry intact with no signs of infection or rash on extremities or on axial skeleton.  Abdomen: Soft nontender  Neuro: Cranial nerves II through XII are intact, neurovascularly intact in all extremities with 2+ DTRs and 2+ pulses.  Lymph: No lymphadenopathy of posterior or anterior cervical chain or axillae bilaterally.  Gait antalgic MSK:  Non tender with full range of motion and good stability and symmetric strength and tone of shoulders, elbows, wrist, hip, knee and ankles bilaterally.  Knee: Right valgus deformity noted. Large thigh to calf ratio.  Tender to palpation over medial and PF joint line.  ROM full in flexion and extension and lower leg rotation. instability with valgus force.  painful patellar compression. Patellar glide with moderate crepitus. Patellar and quadriceps tendons unremarkable. Hamstring and  quadriceps strength is normal. Contralateral knee shows arthritic changes with instability.  After informed written and verbal consent, patient was seated on exam table. Right knee was prepped with alcohol swab and utilizing anterolateral approach, patient's right knee space was injected with 4:1  marcaine 0.5%: Kenalog 40mg /dL. Patient tolerated the procedure well without immediate complications.  After informed written and verbal consent, patient was seated on exam table. Left shoulder was prepped with alcohol swab and utilizing posterior approach, patient's right glenohumeral space was injected with 4:1  marcaine 0.5%: Kenalog 40mg /dL. Patient tolerated the procedure well without immediate complications.     Impression and Recommendations:      The above documentation has been reviewed and is accurate and  complete Lyndal Pulley, DO       Note: This dictation was prepared with Dragon dictation along with smaller phrase technology. Any transcriptional errors that result from this process are unintentional.

## 2018-10-18 NOTE — Assessment & Plan Note (Addendum)
Patient given injection.  Tolerated the procedure well.  Discussed icing regimen and home exercise.  Discussed which activities doing which went to avoid.  If any worsening symptoms may need advanced imaging.

## 2018-10-18 NOTE — Patient Instructions (Signed)
Good to see you  Poked a couple holes in you  Ice is your friend If you guys need anything you know where I am  We will get approval for the other injections just in case Ice is your friend See me again in 3-4 weeks

## 2018-11-15 ENCOUNTER — Ambulatory Visit: Payer: Medicare Other | Admitting: Family Medicine

## 2018-11-15 ENCOUNTER — Encounter: Payer: Self-pay | Admitting: Family Medicine

## 2018-11-15 DIAGNOSIS — M170 Bilateral primary osteoarthritis of knee: Secondary | ICD-10-CM | POA: Diagnosis not present

## 2018-11-15 NOTE — Assessment & Plan Note (Signed)
Started Visco supplementation.  Has responded in the past.  Last time was greater than a year ago.  Will follow-up in 1 week for second in a series of 4 injections.

## 2018-11-15 NOTE — Patient Instructions (Signed)
Good to see you  Ice is your friend 1 down and 3 to go  See you again next week!

## 2018-11-15 NOTE — Progress Notes (Signed)
[name] [address], [place] [address] Phone: [phone] Subjective:   Diane [name], am serving as a scribe for Dr. Hulan Saas.  CC: Right knee pain  [id number]  Diane [name] is a 67 y.o. female coming in with complaint of right knee pain.  Arthritic changes.  Discussed icing regimen and home exercises.     Past Medical History:  Diagnosis Date  . Allergy   . Chicken pox   . GERD (gastroesophageal reflux disease)   . Knee pain   . Seasonal allergic rhinitis   . Vitamin D deficiency    Past Surgical History:  Procedure Laterality Date  . COLONOSCOPY  2009  . [place], 2005  . wisdom teeth exraction     Social History   Socioeconomic History  . Marital status: Married    Spouse name: Not on file  . Number of children: Not on file  . Years of education: Not on file  . Highest education level: Not on file  Occupational History  . Not on file  Social Needs  . Financial resource strain: Not on file  . Food insecurity:    Worry: Not on file    Inability: Not on file  . Transportation needs:    Medical: Not on file    Non-medical: Not on file  Tobacco Use  . Smoking status: Never Smoker  . Smokeless tobacco: Never Used  Substance and Sexual Activity  . Alcohol use: [name]  . Drug use: [name]  . Sexual activity: Yes    Partners: Male  Lifestyle  . Physical activity:    Days per week: Not on file    Minutes per session: Not on file  . Stress: Not on file  Relationships  . Social connections:    Talks on phone: Not on file    Gets together: Not on file    Attends religious service: Not on file    Active member of club or organization: Not on file    Attends meetings of clubs or organizations: Not on file    Relationship status: Not on file  Other Topics Concern  . Not on file  Social History Narrative  . Not on file   [name] Known Allergies Family History  Problem Relation Age of Onset  . Heart  disease Mother   . Heart disease Father   . Colon cancer Neg Hx   . Stomach cancer Neg Hx   . Esophageal cancer Neg Hx   . Pancreatic cancer Neg Hx   . Rectal cancer Neg Hx         Current Outpatient Medications (Respiratory):  .  fluticasone (FLONASE) 50 MCG/ACT nasal spray, Place 2 sprays into both nostrils daily. (Patient taking differently: Place 2 sprays into both nostrils daily as needed. )   Current Outpatient Medications (Analgesics):  .  traMADol (ULTRAM) 50 MG tablet, Take 1 tablet (50 mg total) by mouth every 12 (twelve) hours as needed for severe pain.     Current Outpatient Medications (Other):  Marland Kitchen  Ascorbic Acid (VITAMIN C GUMMIES PO), Take 1 tablet by mouth daily. .  Biotin 1000 MCG tablet, Take 1,000 mcg by mouth daily. .  Cholecalciferol (VITAMIN D PO), Take by mouth. .  Diclofenac Sodium (PENNSAID) 2 % SOLN, Place 2 application onto the skin 2 (two) times daily. Marland Kitchen  glucosamine-chondroitin 500-400 MG tablet, Take 1 tablet by mouth daily. .  Multiple Vitamin (MULTIVITAMIN) tablet,  Take 1 tablet by mouth daily. .  ranitidine (ZANTAC) 150 MG tablet, Take 1 tablet (150 mg total) by mouth 2 (two) times daily. Take 1-2 tablets daily as needed .  Vitamin D, Ergocalciferol, (DRISDOL) 50000 units CAPS capsule, Take 1 capsule (50,000 Units total) by mouth once a week.  Current Facility-Administered Medications (Other):  .  gi cocktail (Maalox,Lidocaine,Donnatal)* * These medications belong to multiple therapeutic classes and are listed under each applicable group.    Past medical history, social, surgical and family history all reviewed in electronic medical record.  [name] pertanent information unless stated regarding to the chief complaint.   Review of Systems:  [name] headache, visual changes, nausea, vomiting, diarrhea, constipation, dizziness, abdominal pain, skin rash, fevers, chills, night sweats, weight loss, swollen lymph nodes, body aches, joint swelling, , chest  pain, shortness of breath, mood changes.  Positive muscle aches  Objective  Blood pressure 102/78, pulse 71, height 5\' 7"  (1.702 m), weight 154 lb (69.9 kg), SpO2 98 %.   General: [name] apparent distress alert and oriented x3 mood and affect normal, dressed appropriately.  HEENT: Pupils equal, extraocular movements intact  Respiratory: Patient's speak in full sentences and does not appear short of breath  Cardiovascular: [name] lower extremity edema, non tender, [name] erythema  Skin: Warm dry intact with [name] signs of infection or rash on extremities or on axial skeleton.  Abdomen: Soft nontender  Neuro: Cranial nerves II through XII are intact, neurovascularly intact in all extremities with 2+ DTRs and 2+ pulses.  Lymph: [name] lymphadenopathy of posterior or anterior cervical chain or axillae bilaterally.  Gait antalgic MSK:  Non tender with full range of motion and good stability and symmetric strength and tone of shoulders, elbows, wrist, hip, and ankles bilaterally.   Knee: Right valgus deformity noted.  Abnormal thigh to calf ratio.  Tender to palpation over medial and PF joint line.  ROM full in flexion and extension and lower leg rotation. instability with valgus force.  painful patellar compression. Patellar glide with moderate crepitus. Patellar and quadriceps tendons unremarkable. Hamstring and quadriceps strength is normal. Contralateral knee shows mild arthritic changes as well  After informed written and verbal consent, patient was seated on exam table. Right knee was prepped with alcohol swab and utilizing anterolateral approach, patient's right knee space was injected with15 mg/2.5 mL of Orthovisc(sodium hyaluronate) in a prefilled syringe was injected easily into the knee through a 22-gauge needle..Patient tolerated the procedure well without immediate complications.   Impression and Recommendations:     The above documentation has been reviewed and is accurate and complete Lyndal Pulley, DO       Note: This dictation was prepared with Dragon dictation along with smaller phrase technology. Any transcriptional errors that result from this process are unintentional.

## 2018-11-21 NOTE — Progress Notes (Signed)
[name] [address] [place], [place] [address] Phone: [phone] Subjective:    I [name] am serving as a Education administrator for Dr. Hulan Saas.  CC: Bilateral knee pain  [id number]  Diane [name] is a 67 y.o. female coming in with complaint of bilateral knee pain. Right orthovisc. Knees are doing ok. Patient is here for second in a series of 4 injections.  Only the right knee.  No improvement with the last injection     Past Medical History:  Diagnosis Date  . Allergy   . Chicken pox   . GERD (gastroesophageal reflux disease)   . Knee pain   . Seasonal allergic rhinitis   . Vitamin D deficiency    Past Surgical History:  Procedure Laterality Date  . COLONOSCOPY  2009  . [place], 2005  . wisdom teeth exraction     Social History   Socioeconomic History  . Marital status: Married    Spouse name: Not on file  . Number of children: Not on file  . Years of education: Not on file  . Highest education level: Not on file  Occupational History  . Not on file  Social Needs  . Financial resource strain: Not on file  . Food insecurity:    Worry: Not on file    Inability: Not on file  . Transportation needs:    Medical: Not on file    Non-medical: Not on file  Tobacco Use  . Smoking status: Never Smoker  . Smokeless tobacco: Never Used  Substance and Sexual Activity  . Alcohol use: No  . Drug use: No  . Sexual activity: Yes    Partners: Male  Lifestyle  . Physical activity:    Days per week: Not on file    Minutes per session: Not on file  . Stress: Not on file  Relationships  . Social connections:    Talks on phone: Not on file    Gets together: Not on file    Attends religious service: Not on file    Active member of club or organization: Not on file    Attends meetings of clubs or organizations: Not on file    Relationship status: Not on file  Other Topics Concern  . Not on file  Social History Narrative    . Not on file   No Known Allergies Family History  Problem Relation Age of Onset  . Heart disease Mother   . Heart disease Father   . Colon cancer Neg Hx   . Stomach cancer Neg Hx   . Esophageal cancer Neg Hx   . Pancreatic cancer Neg Hx   . Rectal cancer Neg Hx         Current Outpatient Medications (Respiratory):  .  fluticasone (FLONASE) 50 MCG/ACT nasal spray, Place 2 sprays into both nostrils daily. (Patient taking differently: Place 2 sprays into both nostrils daily as needed. )   Current Outpatient Medications (Analgesics):  .  traMADol (ULTRAM) 50 MG tablet, Take 1 tablet (50 mg total) by mouth every 12 (twelve) hours as needed for severe pain.     Current Outpatient Medications (Other):  Marland Kitchen  Ascorbic Acid (VITAMIN C GUMMIES PO), Take 1 tablet by mouth daily. .  Biotin 1000 MCG tablet, Take 1,000 mcg by mouth daily. .  Cholecalciferol (VITAMIN D PO), Take by mouth. .  Diclofenac Sodium (PENNSAID) 2 % SOLN, Place 2 application onto the skin 2 (  two) times daily. Marland Kitchen  glucosamine-chondroitin 500-400 MG tablet, Take 1 tablet by mouth daily. .  Multiple Vitamin (MULTIVITAMIN) tablet, Take 1 tablet by mouth daily. .  ranitidine (ZANTAC) 150 MG tablet, Take 1 tablet (150 mg total) by mouth 2 (two) times daily. Take 1-2 tablets daily as needed .  Vitamin D, Ergocalciferol, (DRISDOL) 50000 units CAPS capsule, Take 1 capsule (50,000 Units total) by mouth once a week.  Current Facility-Administered Medications (Other):  .  gi cocktail (Maalox,Lidocaine,Donnatal)* * These medications belong to multiple therapeutic classes and are listed under each applicable group.    Past medical history, social, surgical and family history all reviewed in electronic medical record.  No pertanent information unless stated regarding to the chief complaint.   Review of Systems:  No headache, visual changes, nausea, vomiting, diarrhea, constipation, dizziness, abdominal pain, skin rash,  fevers, chills, night sweats, weight loss, swollen lymph nodes, body aches, joint swelling, muscle aches, Mild positive muscle aches  Objective  Blood pressure 130/70, pulse 74, height 5\' 7"  (1.702 m), SpO2 98 %.    General: No apparent distress alert and oriented x3 mood and affect normal, dressed appropriately.  HEENT: Pupils equal, extraocular movements intact  Respiratory: Patient's speak in full sentences and does not appear short of breath  Cardiovascular: No lower extremity edema, non tender, no erythema  Skin: Warm dry intact with no signs of infection or rash on extremities or on axial skeleton.  Abdomen: Soft nontender  Neuro: Cranial nerves II through XII are intact, neurovascularly intact in all extremities with 2+ DTRs and 2+ pulses.  Lymph: No lymphadenopathy of posterior or anterior cervical chain or axillae bilaterally.  Gait antalgic Knee: Right valgus deformity noted. Large thigh to calf ratio.  Tender to palpation over medial and PF joint line.  ROM full in flexion and extension and lower leg rotation. instability with valgus force.  painful patellar compression. Patellar glide with moderate crepitus. Patellar and quadriceps tendons unremarkable. Hamstring and quadriceps strength is normal. Contralateral knee shows mild arthritic changes as well  After informed written and verbal consent, patient was seated on exam table. Right knee was prepped with alcohol swab and utilizing anterolateral approach, patient's right knee space was injected with15 mg/2.5 mL of Orthovisc(sodium hyaluronate) in a prefilled syringe was injected easily into the knee through a 22-gauge needle..Patient tolerated the procedure well without immediate complications.    Impression and Recommendations:     This case required medical decision making of moderate complexity. The above documentation has been reviewed and is accurate and complete Lyndal Pulley, DO       Note: This dictation  was prepared with Dragon dictation along with smaller phrase technology. Any transcriptional errors that result from this process are unintentional.

## 2018-11-22 ENCOUNTER — Ambulatory Visit: Payer: Medicare Other | Admitting: Family Medicine

## 2018-11-22 ENCOUNTER — Encounter: Payer: Self-pay | Admitting: Family Medicine

## 2018-11-22 DIAGNOSIS — M170 Bilateral primary osteoarthritis of knee: Secondary | ICD-10-CM

## 2018-11-22 NOTE — Assessment & Plan Note (Signed)
Right-sided viscosupplementation given.  Follow-up in 1 week for third in a series of 4 injections

## 2018-11-22 NOTE — Patient Instructions (Signed)
Good to see you  You know the drill  See you son

## 2018-11-29 NOTE — Progress Notes (Signed)
[name] [address] [place], [place] [address] Phone: [phone] Subjective:    I Diane [name] am serving as a Education administrator for Dr. Hulan Saas.    CC: Knee pain  [id number]  Diane [name] is a 67 y.o. female coming in with complaint of knee pain. Knee is making improvements.  Patient is here for injection in the knee has been helping well.  No significant pain.  Patient has been doing a lot more activity and no worsening of the symptoms.  Does not feel that she is having as much swelling      Past Medical History:  Diagnosis Date  . Allergy   . Chicken pox   . GERD (gastroesophageal reflux disease)   . Knee pain   . Seasonal allergic rhinitis   . Vitamin D deficiency    Past Surgical History:  Procedure Laterality Date  . COLONOSCOPY  2009  . [place], 2005  . wisdom teeth exraction     Social History   Socioeconomic History  . Marital status: Married    Spouse name: Not on file  . Number of children: Not on file  . Years of education: Not on file  . Highest education level: Not on file  Occupational History  . Not on file  Social Needs  . Financial resource strain: Not on file  . Food insecurity:    Worry: Not on file    Inability: Not on file  . Transportation needs:    Medical: Not on file    Non-medical: Not on file  Tobacco Use  . Smoking status: Never Smoker  . Smokeless tobacco: Never Used  Substance and Sexual Activity  . Alcohol use: No  . Drug use: No  . Sexual activity: Yes    Partners: Male  Lifestyle  . Physical activity:    Days per week: Not on file    Minutes per session: Not on file  . Stress: Not on file  Relationships  . Social connections:    Talks on phone: Not on file    Gets together: Not on file    Attends religious service: Not on file    Active member of club or organization: Not on file    Attends meetings of clubs or organizations: Not on file    Relationship status: Not  on file  Other Topics Concern  . Not on file  Social History Narrative  . Not on file   No Known Allergies Family History  Problem Relation Age of Onset  . Heart disease Mother   . Heart disease Father   . Colon cancer Neg Hx   . Stomach cancer Neg Hx   . Esophageal cancer Neg Hx   . Pancreatic cancer Neg Hx   . Rectal cancer Neg Hx         Current Outpatient Medications (Respiratory):  .  fluticasone (FLONASE) 50 MCG/ACT nasal spray, Place 2 sprays into both nostrils daily. (Patient taking differently: Place 2 sprays into both nostrils daily as needed. )   Current Outpatient Medications (Analgesics):  .  traMADol (ULTRAM) 50 MG tablet, Take 1 tablet (50 mg total) by mouth every 12 (twelve) hours as needed for severe pain.     Current Outpatient Medications (Other):  Marland Kitchen  Ascorbic Acid (VITAMIN C GUMMIES PO), Take 1 tablet by mouth daily. .  Biotin 1000 MCG tablet, Take 1,000 mcg by mouth daily. .  Cholecalciferol (VITAMIN D PO),  Take by mouth. .  Diclofenac Sodium (PENNSAID) 2 % SOLN, Place 2 application onto the skin 2 (two) times daily. Marland Kitchen  glucosamine-chondroitin 500-400 MG tablet, Take 1 tablet by mouth daily. .  Multiple Vitamin (MULTIVITAMIN) tablet, Take 1 tablet by mouth daily. .  ranitidine (ZANTAC) 150 MG tablet, Take 1 tablet (150 mg total) by mouth 2 (two) times daily. Take 1-2 tablets daily as needed .  Vitamin D, Ergocalciferol, (DRISDOL) 50000 units CAPS capsule, Take 1 capsule (50,000 Units total) by mouth once a week.  Current Facility-Administered Medications (Other):  .  gi cocktail (Maalox,Lidocaine,Donnatal)* * These medications belong to multiple therapeutic classes and are listed under each applicable group.    Past medical history, social, surgical and family history all reviewed in electronic medical record.  No pertanent information unless stated regarding to the chief complaint.   Review of Systems:  No headache, visual changes, nausea,  vomiting, diarrhea, constipation, dizziness, abdominal pain, skin rash, fevers, chills, night sweats, weight loss, swollen lymph nodes, body aches, joint swelling, muscle aches, chest pain, shortness of breath, mood changes.   Objective  There were no vitals taken for this visit. Systems examined below as of    General: No apparent distress alert and oriented x3 mood and affect normal, dressed appropriately.   After informed written and verbal consent, patient was seated on exam table. Right knee was prepped with alcohol swab and utilizing anterolateral approach, patient's right knee space was injected with15 mg/2.5 mL of Orthovisc(sodium hyaluronate) in a prefilled syringe was injected easily into the knee through a 22-gauge needle..Patient tolerated the procedure well without immediate complications.   Impression and Recommendations:     The above documentation has been reviewed and is accurate and complete Lyndal Pulley, DO       Note: This dictation was prepared with Dragon dictation along with smaller phrase technology. Any transcriptional errors that result from this process are unintentional.

## 2018-11-30 ENCOUNTER — Encounter: Payer: Self-pay | Admitting: Family Medicine

## 2018-11-30 ENCOUNTER — Ambulatory Visit: Payer: Medicare Other | Admitting: Family Medicine

## 2018-11-30 DIAGNOSIS — M170 Bilateral primary osteoarthritis of knee: Secondary | ICD-10-CM | POA: Diagnosis not present

## 2018-11-30 NOTE — Assessment & Plan Note (Signed)
Right knee injected again today.  The third in the series of 4 injections given today.  Follow-up in 1 week for fourth and final injection.  Continue conservative therapy otherwise.

## 2018-11-30 NOTE — Patient Instructions (Addendum)
Good to see you  Ice is your friend  Stay active See you next week for the finale!

## 2018-12-05 NOTE — Progress Notes (Signed)
[name] [place] [place], [place] [address] Phone: [phone] Subjective:   [name], am serving as a scribe for Dr. Hulan Saas.   CC: Right knee follow-up  BHA:[id number]  Diane [name] is a 67 y.o. female coming in with complaint of right knee pain. She is here today for injection 4/4 of the Orthovisc series. She has not noticed an improvement in her pain. Patient has been standing a lot recently and the cold weather has also increased her pain.        Past Medical History:  Diagnosis Date  . Allergy   . Chicken pox   . GERD (gastroesophageal reflux disease)   . Knee pain   . Seasonal allergic rhinitis   . Vitamin D deficiency    Past Surgical History:  Procedure Laterality Date  . COLONOSCOPY  2009  . [place], 2005  . wisdom teeth exraction     Social History   Socioeconomic History  . Marital status: Married    Spouse name: Not on file  . Number of children: Not on file  . Years of education: Not on file  . Highest education level: Not on file  Occupational History  . Not on file  Social Needs  . Financial resource strain: Not on file  . Food insecurity:    Worry: Not on file    Inability: Not on file  . Transportation needs:    Medical: Not on file    Non-medical: Not on file  Tobacco Use  . Smoking status: Never Smoker  . Smokeless tobacco: Never Used  Substance and Sexual Activity  . Alcohol use: No  . Drug use: No  . Sexual activity: Yes    Partners: Male  Lifestyle  . Physical activity:    Days per week: Not on file    Minutes per session: Not on file  . Stress: Not on file  Relationships  . Social connections:    Talks on phone: Not on file    Gets together: Not on file    Attends religious service: Not on file    Active member of club or organization: Not on file    Attends meetings of clubs or organizations: Not on file    Relationship status: Not on file  Other Topics  Concern  . Not on file  Social History Narrative  . Not on file   No Known Allergies Family History  Problem Relation Age of Onset  . Heart disease Mother   . Heart disease Father   . Colon cancer Neg Hx   . Stomach cancer Neg Hx   . Esophageal cancer Neg Hx   . Pancreatic cancer Neg Hx   . Rectal cancer Neg Hx         Current Outpatient Medications (Respiratory):  .  fluticasone (FLONASE) 50 MCG/ACT nasal spray, Place 2 sprays into both nostrils daily. (Patient taking differently: Place 2 sprays into both nostrils daily as needed. )   Current Outpatient Medications (Analgesics):  .  traMADol (ULTRAM) 50 MG tablet, Take 1 tablet (50 mg total) by mouth every 12 (twelve) hours as needed for severe pain.     Current Outpatient Medications (Other):  Marland Kitchen  Ascorbic Acid (VITAMIN C GUMMIES PO), Take 1 tablet by mouth daily. .  Biotin 1000 MCG tablet, Take 1,000 mcg by mouth daily. .  Cholecalciferol (VITAMIN D PO), Take by mouth. .  Diclofenac Sodium (PENNSAID) 2 %  SOLN, Place 2 application onto the skin 2 (two) times daily. Marland Kitchen  glucosamine-chondroitin 500-400 MG tablet, Take 1 tablet by mouth daily. .  Multiple Vitamin (MULTIVITAMIN) tablet, Take 1 tablet by mouth daily. .  ranitidine (ZANTAC) 150 MG tablet, Take 1 tablet (150 mg total) by mouth 2 (two) times daily. Take 1-2 tablets daily as needed .  Vitamin D, Ergocalciferol, (DRISDOL) 50000 units CAPS capsule, Take 1 capsule (50,000 Units total) by mouth once a week.  Current Facility-Administered Medications (Other):  .  gi cocktail (Maalox,Lidocaine,Donnatal)* * These medications belong to multiple therapeutic classes and are listed under each applicable group.    Past medical history, social, surgical and family history all reviewed in electronic medical record.  No pertanent information unless stated regarding to the chief complaint.   Review of Systems:  No headache, visual changes, nausea, vomiting, diarrhea,  constipation, dizziness, abdominal pain, skin rash, fevers, chills, night sweats, weight loss, swollen lymph nodes, body aches, joint swelling, muscle aches, chest pain, shortness of breath, mood changes.   Objective  Blood pressure 128/78, pulse 74, height 5\' 7"  (1.702 m), weight 154 lb (69.9 kg), SpO2 97 %.    General: No apparent distress alert and oriented x3 mood and affect normal, dressed appropriately.   After informed written and verbal consent, patient was seated on exam table. Right knee was prepped with alcohol swab and utilizing anterolateral approach, patient's right knee space was injected with15 mg/2.5 mL of Orthovisc(sodium hyaluronate) in a prefilled syringe was injected easily into the knee through a 22-gauge needle..Patient tolerated the procedure well without immediate complications.   Impression and Recommendations:      The above documentation has been reviewed and is accurate and complete Lyndal Pulley, DO       Note: This dictation was prepared with Dragon dictation along with smaller phrase technology. Any transcriptional errors that result from this process are unintentional.

## 2018-12-06 ENCOUNTER — Ambulatory Visit: Payer: Medicare Other | Admitting: Family Medicine

## 2018-12-06 DIAGNOSIS — M170 Bilateral primary osteoarthritis of knee: Secondary | ICD-10-CM | POA: Diagnosis not present

## 2018-12-06 NOTE — Assessment & Plan Note (Signed)
Vital injection for Orthovisc given today.  Discussed how this may take more time.  Discussed using the brace on a more regular basis icing regimen and home exercises and following up again in 4 weeks

## 2018-12-06 NOTE — Patient Instructions (Signed)
Will miss you  Happy holidays!  See me when you need me!

## 2018-12-28 LAB — HM MAMMOGRAPHY

## 2019-01-21 ENCOUNTER — Other Ambulatory Visit: Payer: Self-pay | Admitting: Physical Therapy

## 2019-01-21 MED ORDER — VITAMIN D (ERGOCALCIFEROL) 1.25 MG (50000 UNIT) PO CAPS: 50000.0000 [IU] | ORAL_CAPSULE | ORAL | 1 refills | Status: DC

## 2019-01-24 ENCOUNTER — Encounter: Payer: Self-pay | Admitting: Internal Medicine

## 2019-01-24 ENCOUNTER — Ambulatory Visit: Payer: Medicare Other | Admitting: Internal Medicine

## 2019-01-24 VITALS — BP 118/70 | HR 72 | Temp 98.1°F | Resp 16 | Ht 67.0 in | Wt 158.5 lb

## 2019-01-24 DIAGNOSIS — R21 Rash and other nonspecific skin eruption: Secondary | ICD-10-CM

## 2019-01-24 MED ORDER — VALACYCLOVIR HCL 1 G PO TABS: 1000.0000 mg | ORAL_TABLET | Freq: Three times a day (TID) | ORAL | 0 refills | Status: DC

## 2019-01-24 NOTE — Progress Notes (Signed)
Subjective:    Patient ID: Diane [name], female    DOB: [birth year], 68 y.o.   MRN: [id number]  DOS:  01/24/2019 Type of visit - description: Acute visit Symptoms of started 3 days ago with a rash on her neck.  Her husband look at it and he thought they were blisters. Reports some pain/itching described as a tingling sensation "like when I have a fever blister".  Review of Systems Admits to some recent stress No fever chills No pain beyond the rash.  Past Medical History:  Diagnosis Date  . Allergy   . Chicken pox   . GERD (gastroesophageal reflux disease)   . Knee pain   . Seasonal allergic rhinitis   . Vitamin D deficiency     Past Surgical History:  Procedure Laterality Date  . COLONOSCOPY  2009  . [place], 2005  . wisdom teeth exraction      Social History   Socioeconomic History  . Marital status: Married    Spouse name: Not on file  . Number of children: Not on file  . Years of education: Not on file  . Highest education level: Not on file  Occupational History  . Not on file  Social Needs  . Financial resource strain: Not on file  . Food insecurity:    Worry: Not on file    Inability: Not on file  . Transportation needs:    Medical: Not on file    Non-medical: Not on file  Tobacco Use  . Smoking status: Never Smoker  . Smokeless tobacco: Never Used  Substance and Sexual Activity  . Alcohol use: No  . Drug use: No  . Sexual activity: Yes    Partners: Male  Lifestyle  . Physical activity:    Days per week: Not on file    Minutes per session: Not on file  . Stress: Not on file  Relationships  . Social connections:    Talks on phone: Not on file    Gets together: Not on file    Attends religious service: Not on file    Active member of club or organization: Not on file    Attends meetings of clubs or organizations: Not on file    Relationship status: Not on file  . Intimate partner violence:    Fear of current or ex partner: Not  on file    Emotionally abused: Not on file    Physically abused: Not on file    Forced sexual activity: Not on file  Other Topics Concern  . Not on file  Social History Narrative  . Not on file      Allergies as of 01/24/2019   No Known Allergies     Medication List       Accurate as of January 24, 2019 11:36 AM. Always use your most recent med list.        Biotin 1000 MCG tablet Take 1,000 mcg by mouth daily.   Diclofenac Sodium 2 % Soln Commonly known as:  PENNSAID Place 2 application onto the skin 2 (two) times daily.   fluticasone 50 MCG/ACT nasal spray Commonly known as:  FLONASE Place 2 sprays into both nostrils daily.   glucosamine-chondroitin 500-400 MG tablet Take 1 tablet by mouth daily.   multivitamin tablet Take 1 tablet by mouth daily.   ranitidine 150 MG tablet Commonly known as:  ZANTAC Take 1 tablet (150 mg total) by mouth 2 (two) times daily. Take 1-2  tablets daily as needed   traMADol 50 MG tablet Commonly known as:  ULTRAM Take 1 tablet (50 mg total) by mouth every 12 (twelve) hours as needed for severe pain.   VITAMIN C GUMMIES PO Take 1 tablet by mouth daily.   Vitamin D (Ergocalciferol) 1.25 MG (50000 UT) Caps capsule Commonly known as:  DRISDOL Take 1 capsule (50,000 Units total) by mouth once a week.   VITAMIN D PO Take by mouth.           Objective:   Physical Exam Neck:     BP 118/70 (BP Location: Left Arm, Patient Position: Sitting, Cuff Size: Small)   Pulse 72   Temp 98.1 F (36.7 C) (Oral)   Resp 16   Ht 5\' 7"  (1.702 m)   Wt 158 lb 8 oz (71.9 kg)   SpO2 97%   BMI 24.82 kg/m   General:   Well developed, NAD, BMI noted. HEENT:  Normocephalic . Face symmetric, atraumatic  Neurologic:  alert & oriented X3.  Speech normal, gait appropriate for age and unassisted Psych--  Cognition and judgment appear intact.  Cooperative with normal attention span and concentration.  Behavior appropriate. No anxious or  depressed appearing.      Assessment     68 year old lady, last visit with me 2013, history of DJD, GERD presents with:  Rash: Suspect herpetic rash/shingles . She had Zostavax years ago. Plan:  Valtrex x1 week, call if not better.  She is contagious, needs to keep the area covered.  Encouraged to come back for a physical exam at her convenience

## 2019-01-24 NOTE — Progress Notes (Signed)
Pre visit review using our clinic review tool, if applicable. No additional management support is needed unless otherwise documented below in the visit note. 

## 2019-01-24 NOTE — Patient Instructions (Addendum)
Please schedule a physical exam at your earliest convenience.   Take the medications as prescribed for 1 week  Call if not gradually better or if you develop any pain in the area or the rash continues spreading

## 2019-01-27 ENCOUNTER — Encounter: Payer: Self-pay | Admitting: Family Medicine

## 2019-01-27 ENCOUNTER — Ambulatory Visit: Payer: Self-pay

## 2019-01-27 ENCOUNTER — Ambulatory Visit: Payer: Medicare Other | Admitting: Family Medicine

## 2019-01-27 VITALS — BP 104/76 | HR 73 | Ht 67.0 in | Wt 159.0 lb

## 2019-01-27 DIAGNOSIS — M25561 Pain in right knee: Principal | ICD-10-CM

## 2019-01-27 DIAGNOSIS — M170 Bilateral primary osteoarthritis of knee: Secondary | ICD-10-CM

## 2019-01-27 DIAGNOSIS — G8929 Other chronic pain: Secondary | ICD-10-CM | POA: Diagnosis not present

## 2019-01-27 NOTE — Assessment & Plan Note (Signed)
Given injection.  Tolerated the procedure well.  Discussed icing regimen and home exercise.  Discussed which activities to do which was to avoid.  Discussed topical anti-inflammatories.  Discussed which activities.  Follow-up again in 4 to 8 weeks

## 2019-01-27 NOTE — Progress Notes (Signed)
[name] [address] [place], [place] [address] Phone: [phone] Subjective:     I [name] am serving as a Education administrator for Dr. Hulan Saas.   CC: Knee pain  [id number]    12/06/2018  Vital injection for Orthovisc given today.  Discussed how this may take more time.  Discussed using the brace on a more regular basis icing regimen and home exercises and following up again in 4 weeks.  Updated 01/27/2019  JOZLYNN PLAIA is a 68 y.o. female coming in with complaint of bilateral knee pain. Believes the knee needs to be drained.  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  Patient states that the right knee is giving her significantly more difficulty.  Noticed more swelling.  Decreasing range of motion.  Rates the severity of pain 8 out of 10.  Has been a lot more active.  Going through renovation of the house and have to do more stairs recently.     Past Medical History:  Diagnosis Date  . Allergy   . Chicken pox   . GERD (gastroesophageal reflux disease)   . Knee pain   . Seasonal allergic rhinitis   . Vitamin D deficiency    Past Surgical History:  Procedure Laterality Date  . COLONOSCOPY  2009  . [place], 2005  . wisdom teeth exraction     Social History   Socioeconomic History  . Marital status: Married    Spouse name: Not on file  . Number of children: Not on file  . Years of education: Not on file  . Highest education level: Not on file  Occupational History  . Not on file  Social Needs  . Financial resource strain: Not on file  . Food insecurity:    Worry: Not on file    Inability: Not on file  . Transportation needs:    Medical: Not on file    Non-medical: Not on file  Tobacco Use  . Smoking status: Never Smoker  . Smokeless tobacco: Never Used  Substance and Sexual Activity  . Alcohol use: No  . Drug use: No  . Sexual activity: Yes    Partners: Male  Lifestyle  .  Physical activity:    Days per week: Not on file    Minutes per session: Not on file  . Stress: Not on file  Relationships  . Social connections:    Talks on phone: Not on file    Gets together: Not on file    Attends religious service: Not on file    Active member of club or organization: Not on file    Attends meetings of clubs or organizations: Not on file    Relationship status: Not on file  Other Topics Concern  . Not on file  Social History Narrative  . Not on file   No Known Allergies Family History  Problem Relation Age of Onset  . Heart disease Mother   . Heart disease Father   . Colon cancer Neg Hx   . Stomach cancer Neg Hx   . Esophageal cancer Neg Hx   . Pancreatic cancer Neg Hx   . Rectal cancer Neg Hx       Current Outpatient Medications (Respiratory):  .  fluticasone (FLONASE) 50 MCG/ACT nasal spray, Place 2 sprays into both nostrils daily. (Patient taking differently: Place 2 sprays into both nostrils daily as needed. )    Current Outpatient  Medications (Other):  Marland Kitchen  Ascorbic Acid (VITAMIN C GUMMIES PO), Take 1 tablet by mouth daily. .  Biotin 1000 MCG tablet, Take 1,000 mcg by mouth daily. .  Cholecalciferol (VITAMIN D PO), Take by mouth. .  Diclofenac Sodium (PENNSAID) 2 % SOLN, Place 2 application onto the skin 2 (two) times daily. Marland Kitchen  glucosamine-chondroitin 500-400 MG tablet, Take 1 tablet by mouth daily. .  Multiple Vitamin (MULTIVITAMIN) tablet, Take 1 tablet by mouth daily. .  ranitidine (ZANTAC) 150 MG tablet, Take 1 tablet (150 mg total) by mouth 2 (two) times daily. Take 1-2 tablets daily as needed .  valACYclovir (VALTREX) 1000 MG tablet, Take 1 tablet (1,000 mg total) by mouth 3 (three) times daily. .  Vitamin D, Ergocalciferol, (DRISDOL) 1.25 MG (50000 UT) CAPS capsule, Take 1 capsule (50,000 Units total) by mouth once a week.    Past medical history, social, surgical and family history all reviewed in electronic medical record.  No  pertanent information unless stated regarding to the chief complaint.   Review of Systems:  No headache, visual changes, nausea, vomiting, diarrhea, constipation, dizziness, abdominal pain, skin rash, fevers, chills, night sweats, weight loss, swollen lymph nodes, body aches, joint swelling,, chest pain, shortness of breath, mood changes.  Positive muscle aches  Objective  Blood pressure 104/76, pulse 73, height 5\' 7"  (1.702 m), weight 159 lb (72.1 kg), SpO2 96 %.    General: No apparent distress alert and oriented x3 mood and affect normal, dressed appropriately.  HEENT: Pupils equal, extraocular movements intact  Respiratory: Patient's speak in full sentences and does not appear short of breath  Cardiovascular: No lower extremity edema, non tender, no erythema  Skin: Warm dry intact with no signs of infection or rash on extremities or on axial skeleton.  Abdomen: Soft nontender  Neuro: Cranial nerves II through XII are intact, neurovascularly intact in all extremities with 2+ DTRs and 2+ pulses.  Lymph: No lymphadenopathy of posterior or anterior cervical chain or axillae bilaterally.  Gait normal with good balance and coordination.  MSK:  Non tender with full range of motion and good stability and symmetric strength and tone of shoulders, elbows, wrist, hip and ankles bilaterally.  Knee: Right valgus deformity noted.  Abnormal thigh to calf ratio.  Joint effusion noted Tender to palpation over medial and PF joint line.  ROM full in flexion and extension and lower leg rotation. instability with valgus force.  painful patellar compression.  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 Patellar and quadriceps tendons unremarkable. Hamstring and quadriceps strength is normal. Contralateral knee shows arthritic changes but minimal instability noted today  Procedure: Real-time Ultrasound Guided Injection of right knee Device: GE Logiq  Q7 Ultrasound guided injection is preferred based studies that show increased duration, increased effect, greater accuracy, decreased procedural pain, increased response rate, and decreased cost with ultrasound guided versus blind injection.  Verbal informed consent obtained.  Time-out conducted.  Noted no overlying erythema, induration, or other signs of local infection.  Skin prepped in a sterile fashion.  Local anesthesia: Topical Ethyl chloride.  With sterile technique and under real time ultrasound guidance: With a 22-gauge 2 inch needle patient was injected with 4 cc of 0.5% Marcaine and aspirated 50 cc of straw-colored fluid then injected 1 cc of Kenalog 40 mg/dL. This was from a superior lateral approach.  Completed without difficulty  Pain immediately resolved suggesting accurate placement of the medication.  Advised to call if fevers/chills, erythema,  induration, drainage, or persistent bleeding.  Images permanently stored and available for review in the ultrasound unit.  Impression: Technically successful ultrasound guided injection.    Impression and Recommendations:     This case required medical decision making of moderate complexity. The above documentation has been reviewed and is accurate and complete Lyndal Pulley, DO       Note: This dictation was prepared with Dragon dictation along with smaller phrase technology. Any transcriptional errors that result from this process are unintentional.

## 2019-01-27 NOTE — Patient Instructions (Addendum)
Good to see you  I am sorry the orthovisc did not work great  Ic eis your friend Drinaed the knee again today  Read about PRP  See me again in 4 weeks

## 2019-07-11 ENCOUNTER — Encounter: Payer: Self-pay | Admitting: Internal Medicine

## 2019-08-30 DIAGNOSIS — M8580 Other specified disorders of bone density and structure, unspecified site: Secondary | ICD-10-CM | POA: Insufficient documentation

## 2019-08-30 NOTE — Progress Notes (Signed)
Subjective:    Patient ID: Diane [name], female    DOB: 02-07-1951, 68 y.o.   MRN: [id number]  HPI  She is exercising regularly.   She is here for a physical exam.   She would like to review her supplements and see if which she is taking is adequate and okay.  She also had a question about Previgen and whether or not she should try that.  Medications and allergies reviewed with patient and updated if appropriate.  Patient Active Problem List   Diagnosis Date Noted  . History of esophageal dilatation 08/31/2019  . Osteopenia 08/30/2019  . Cellulitis of right elbow 07/15/2018  . Acute bursitis of left shoulder 02/01/2018  . Acute bursitis of right shoulder 09/17/2017  . Degenerative arthritis of knee, bilateral 12/01/2016  . Costochondral chest pain 05/21/2015    Current Outpatient Medications on File Prior to Visit  Medication Sig Dispense Refill  . Ascorbic Acid (VITAMIN C GUMMIES PO) Take 1 tablet by mouth daily.    . Biotin 1000 MCG tablet Take 1,000 mcg by mouth daily.    . Cholecalciferol (VITAMIN D PO) Take by mouth.    . fluticasone (FLONASE) 50 MCG/ACT nasal spray Place 2 sprays into both nostrils daily. (Patient taking differently: Place 2 sprays into both nostrils daily as needed. ) 16 g 6  . glucosamine-chondroitin 500-400 MG tablet Take 1 tablet by mouth daily.    . Multiple Vitamin (MULTIVITAMIN) tablet Take 1 tablet by mouth daily.     No current facility-administered medications on file prior to visit.     Past Medical History:  Diagnosis Date  . Allergy   . Chicken pox   . GERD (gastroesophageal reflux disease)   . Knee pain   . Seasonal allergic rhinitis   . Vitamin D deficiency     Past Surgical History:  Procedure Laterality Date  . COLONOSCOPY  2009  . [place], 2005  . wisdom teeth exraction      Social History   Socioeconomic History  . Marital status: Married    Spouse name: Not on file  . Number of children: Not on file   . Years of education: Not on file  . Highest education level: Not on file  Occupational History  . Not on file  Social Needs  . Financial resource strain: Not on file  . Food insecurity    Worry: Not on file    Inability: Not on file  . Transportation needs    Medical: Not on file    Non-medical: Not on file  Tobacco Use  . Smoking status: Never Smoker  . Smokeless tobacco: Never Used  Substance and Sexual Activity  . Alcohol use: No  . Drug use: No  . Sexual activity: Yes    Partners: Male  Lifestyle  . Physical activity    Days per week: Not on file    Minutes per session: Not on file  . Stress: Not on file  Relationships  . Social Herbalist on phone: Not on file    Gets together: Not on file    Attends religious service: Not on file    Active member of club or organization: Not on file    Attends meetings of clubs or organizations: Not on file    Relationship status: Not on file  Other Topics Concern  . Not on file  Social History Narrative  . Not on file  Family History  Problem Relation Age of Onset  . Heart disease Mother   . Heart attack Mother   . Heart disease Father   . Aortic aneurysm Father   . Dementia Father   . Colon cancer Neg Hx   . Stomach cancer Neg Hx   . Esophageal cancer Neg Hx   . Pancreatic cancer Neg Hx   . Rectal cancer Neg Hx     Review of Systems  Constitutional: Negative for chills and fever.  HENT: Negative for trouble swallowing.   Eyes: Negative for visual disturbance.  Respiratory: Negative for cough, shortness of breath and wheezing.   Cardiovascular: Negative for chest pain, palpitations and leg swelling.  Gastrointestinal: Negative for abdominal pain, blood in stool, constipation, diarrhea and nausea.       Occ gerd  Genitourinary: Negative for dysuria and hematuria.  Musculoskeletal: Positive for arthralgias (R knee > left knee) and joint swelling.  Skin: Negative for color change and rash.   Neurological: Negative for dizziness, light-headedness, numbness and headaches.  Psychiatric/Behavioral: Negative for dysphoric mood. The patient is not nervous/anxious.        Objective:   Vitals:   08/31/19 1048  BP: (!) 146/84  Pulse: 60  Resp: 16  Temp: 98 F (36.7 C)  SpO2: 98%   Filed Weights   08/31/19 1048  Weight: 155 lb 6.4 oz (70.5 kg)   Body mass index is 24.34 kg/m.  BP Readings from Last 3 Encounters:  08/31/19 (!) 146/84  01/27/19 104/76  01/24/19 118/70    Wt Readings from Last 3 Encounters:  08/31/19 155 lb 6.4 oz (70.5 kg)  01/27/19 159 lb (72.1 kg)  01/24/19 158 lb 8 oz (71.9 kg)     Physical Exam Constitutional: She appears well-developed and well-nourished. No distress.  HENT:  Head: Normocephalic and atraumatic.  Right Ear: External ear normal. Normal ear canal and TM Left Ear: External ear normal.  Normal ear canal and TM Mouth/Throat: Oropharynx is clear and moist.  Eyes: Conjunctivae and EOM are normal.  Neck: Neck supple. No tracheal deviation present. No thyromegaly present.  No carotid bruit  Cardiovascular: Normal rate, regular rhythm and normal heart sounds.   No murmur heard.  No edema. Pulmonary/Chest: Effort normal and breath sounds normal. No respiratory distress. She has no wheezes. She has no rales.  Breast: deferred   Abdominal: Soft. She exhibits no distension. There is no tenderness.  Lymphadenopathy: She has no cervical adenopathy.  Skin: Skin is warm and dry. She is not diaphoretic.  Psychiatric: She has a normal mood and affect. Her behavior is normal.        Assessment & Plan:   Physical exam: Screening blood work ordered Immunizations  Flu vaccine deferred, prevnar discussed, discussed shingrix Colonoscopy   up-to-date  Mammogram   Last done 12/2018 Gyn  Up to date  Dexa  Up to date  Eye exams  Up to date  Exercise  Very active.  She is limited by her knee, but that has improved and she will start to do  some more regular. Weight  Normal BMI Skin  Sees derm annually - no concerns Substance abuse  none  See Problem List for Assessment and Plan of chronic medical problems.  Follow-up annually

## 2019-08-30 NOTE — Patient Instructions (Addendum)
Tests ordered today. Your results will be released to MyChart (or called to you) after review.  If any changes need to be made, you will be notified at that same time.  All other Health Maintenance issues reviewed.   All recommended immunizations and age-appropriate screenings are up-to-date or discussed.  No immunization administered today.   Medications reviewed and updated.  Changes include :   none   Please followup in 1 year   Health Maintenance, Female Adopting a healthy lifestyle and getting preventive care are important in promoting health and wellness. Ask your health care provider about:  The right schedule for you to have regular tests and exams.  Things you can do on your own to prevent diseases and keep yourself healthy. What should I know about diet, weight, and exercise? Eat a healthy diet   Eat a diet that includes plenty of vegetables, fruits, low-fat dairy products, and lean protein.  Do not eat a lot of foods that are high in solid fats, added sugars, or sodium. Maintain a healthy weight Body mass index (BMI) is used to identify weight problems. It estimates body fat based on height and weight. Your health care provider can help determine your BMI and help you achieve or maintain a healthy weight. Get regular exercise Get regular exercise. This is one of the most important things you can do for your health. Most adults should:  Exercise for at least 150 minutes each week. The exercise should increase your heart rate and make you sweat (moderate-intensity exercise).  Do strengthening exercises at least twice a week. This is in addition to the moderate-intensity exercise.  Spend less time sitting. Even light physical activity can be beneficial. Watch cholesterol and blood lipids Have your blood tested for lipids and cholesterol at 67 years of age, then have this test every 5 years. Have your cholesterol levels checked more often if:  Your lipid or cholesterol  levels are high.  You are older than 67 years of age.  You are at high risk for heart disease. What should I know about cancer screening? Depending on your health history and family history, you may need to have cancer screening at various ages. This may include screening for:  Breast cancer.  Cervical cancer.  Colorectal cancer.  Skin cancer.  Lung cancer. What should I know about heart disease, diabetes, and high blood pressure? Blood pressure and heart disease  High blood pressure causes heart disease and increases the risk of stroke. This is more likely to develop in people who have high blood pressure readings, are of African descent, or are overweight.  Have your blood pressure checked: ? Every 3-5 years if you are 18-39 years of age. ? Every year if you are 40 years old or older. Diabetes Have regular diabetes screenings. This checks your fasting blood sugar level. Have the screening done:  Once every three years after age 40 if you are at a normal weight and have a low risk for diabetes.  More often and at a younger age if you are overweight or have a high risk for diabetes. What should I know about preventing infection? Hepatitis B If you have a higher risk for hepatitis B, you should be screened for this virus. Talk with your health care provider to find out if you are at risk for hepatitis B infection. Hepatitis C Testing is recommended for:  Everyone born from 1945 through 1965.  Anyone with known risk factors for hepatitis C. Sexually transmitted   infections (STIs)  Get screened for STIs, including gonorrhea and chlamydia, if: ? You are sexually active and are younger than 67 years of age. ? You are older than 67 years of age and your health care provider tells you that you are at risk for this type of infection. ? Your sexual activity has changed since you were last screened, and you are at increased risk for chlamydia or gonorrhea. Ask your health care  provider if you are at risk.  Ask your health care provider about whether you are at high risk for HIV. Your health care provider may recommend a prescription medicine to help prevent HIV infection. If you choose to take medicine to prevent HIV, you should first get tested for HIV. You should then be tested every 3 months for as long as you are taking the medicine. Pregnancy  If you are about to stop having your period (premenopausal) and you may become pregnant, seek counseling before you get pregnant.  Take 400 to 800 micrograms (mcg) of folic acid every day if you become pregnant.  Ask for birth control (contraception) if you want to prevent pregnancy. Osteoporosis and menopause Osteoporosis is a disease in which the bones lose minerals and strength with aging. This can result in bone fractures. If you are 65 years old or older, or if you are at risk for osteoporosis and fractures, ask your health care provider if you should:  Be screened for bone loss.  Take a calcium or vitamin D supplement to lower your risk of fractures.  Be given hormone replacement therapy (HRT) to treat symptoms of menopause. Follow these instructions at home: Lifestyle  Do not use any products that contain nicotine or tobacco, such as cigarettes, e-cigarettes, and chewing tobacco. If you need help quitting, ask your health care provider.  Do not use street drugs.  Do not share needles.  Ask your health care provider for help if you need support or information about quitting drugs. Alcohol use  Do not drink alcohol if: ? Your health care provider tells you not to drink. ? You are pregnant, may be pregnant, or are planning to become pregnant.  If you drink alcohol: ? Limit how much you use to 0-1 drink a day. ? Limit intake if you are breastfeeding.  Be aware of how much alcohol is in your drink. In the [place] one drink equals one 12 oz bottle of beer (355 mL), one 5 oz glass of wine (148 mL), or one 1  oz glass of hard liquor (44 mL). General instructions  Schedule regular health, dental, and eye exams.  Stay current with your vaccines.  Tell your health care provider if: ? You often feel depressed. ? You have ever been abused or do not feel safe at home. Summary  Adopting a healthy lifestyle and getting preventive care are important in promoting health and wellness.  Follow your health care provider's instructions about healthy diet, exercising, and getting tested or screened for diseases.  Follow your health care provider's instructions on monitoring your cholesterol and blood pressure. This information is not intended to replace advice given to you by your health care provider. Make sure you discuss any questions you have with your health care provider. Document Released: 06/16/2011 Document Revised: 11/24/2018 Document Reviewed: 11/24/2018 Elsevier Patient Education  2020 [employer]  

## 2019-08-31 ENCOUNTER — Ambulatory Visit (INDEPENDENT_AMBULATORY_CARE_PROVIDER_SITE_OTHER): Payer: Medicare Other | Admitting: Internal Medicine

## 2019-08-31 ENCOUNTER — Encounter: Payer: Self-pay | Admitting: Internal Medicine

## 2019-08-31 ENCOUNTER — Other Ambulatory Visit: Payer: Self-pay

## 2019-08-31 ENCOUNTER — Other Ambulatory Visit (INDEPENDENT_AMBULATORY_CARE_PROVIDER_SITE_OTHER): Payer: Medicare Other

## 2019-08-31 VITALS — BP 146/84 | HR 60 | Temp 98.0°F | Resp 16 | Ht 67.0 in | Wt 155.4 lb

## 2019-08-31 DIAGNOSIS — M85852 Other specified disorders of bone density and structure, left thigh: Secondary | ICD-10-CM

## 2019-08-31 DIAGNOSIS — Z9889 Other specified postprocedural states: Secondary | ICD-10-CM | POA: Diagnosis not present

## 2019-08-31 DIAGNOSIS — Z0000 Encounter for general adult medical examination without abnormal findings: Secondary | ICD-10-CM

## 2019-08-31 LAB — COMPREHENSIVE METABOLIC PANEL
ALT: 21 U/L (ref 0–35)
AST: 22 U/L (ref 0–37)
Albumin: 4.4 g/dL (ref 3.5–5.2)
Alkaline Phosphatase: 92 U/L (ref 39–117)
BUN: 14 mg/dL (ref 6–23)
CO2: 28 mEq/L (ref 19–32)
Calcium: 9.8 mg/dL (ref 8.4–10.5)
Chloride: 104 mEq/L (ref 96–112)
Creatinine, Ser: 0.67 mg/dL (ref 0.40–1.20)
GFR: 87.81 mL/min (ref >60.00)
Glucose, Bld: 89 mg/dL (ref 70–99)
Potassium: 4.1 mEq/L (ref 3.5–5.1)
Sodium: 139 mEq/L (ref 135–145)
Total Bilirubin: 0.6 mg/dL (ref 0.2–1.2)
Total Protein: 7.2 g/dL (ref 6.0–8.3)

## 2019-08-31 LAB — CBC WITH DIFFERENTIAL/PLATELET
Basophils Absolute: 0.1 10*3/uL (ref 0.0–0.1)
Basophils Relative: 0.8 % (ref 0.0–3.0)
Eosinophils Absolute: 0.1 10*3/uL (ref 0.0–0.7)
Eosinophils Relative: 1.3 % (ref 0.0–5.0)
HCT: 45.9 % (ref 36.0–46.0)
Hemoglobin: 15.4 g/dL — ABNORMAL HIGH (ref 12.0–15.0)
Lymphocytes Relative: 24.3 % (ref 12.0–46.0)
Lymphs Abs: 1.9 10*3/uL (ref 0.7–4.0)
MCHC: 33.6 g/dL (ref 30.0–36.0)
MCV: 93.1 fl (ref 78.0–100.0)
Monocytes Absolute: 0.7 10*3/uL (ref 0.1–1.0)
Monocytes Relative: 8.8 % (ref 3.0–12.0)
Neutro Abs: 5 10*3/uL (ref 1.4–7.7)
Neutrophils Relative %: 64.8 % (ref 43.0–77.0)
Platelets: 229 10*3/uL (ref 150.0–400.0)
RBC: 4.93 Mil/uL (ref 3.87–5.11)
RDW: 13.2 % (ref 11.5–15.5)
WBC: 7.7 10*3/uL (ref 4.0–10.5)

## 2019-08-31 LAB — TSH: TSH: 0.91 u[IU]/mL (ref 0.35–4.50)

## 2019-08-31 LAB — LIPID PANEL
Cholesterol: 251 mg/dL — ABNORMAL HIGH (ref 0–200)
HDL: 58.6 mg/dL (ref >39.00)
LDL Cholesterol: 170 mg/dL — ABNORMAL HIGH (ref 0–99)
NonHDL: 192.46
Total CHOL/HDL Ratio: 4
Triglycerides: 113 mg/dL (ref 0.0–149.0)
VLDL: 22.6 mg/dL (ref 0.0–40.0)

## 2019-08-31 NOTE — Assessment & Plan Note (Addendum)
[facility] 2019: Spine -1.2,  RFN -0.8, LFN -1.2 DEXA up-to-date Advised taking some calcium supplementation daily, take vitamin D daily Encouraged regular exercise

## 2019-09-01 ENCOUNTER — Encounter: Payer: Self-pay | Admitting: Internal Medicine

## 2019-09-01 DIAGNOSIS — E785 Hyperlipidemia, unspecified: Secondary | ICD-10-CM | POA: Insufficient documentation

## 2019-12-01 ENCOUNTER — Ambulatory Visit (INDEPENDENT_AMBULATORY_CARE_PROVIDER_SITE_OTHER)
Admission: RE | Admit: 2019-12-01 | Discharge: 2019-12-01 | Disposition: A | Discharge status: Home / Self Care | Payer: Medicare Other | Source: Ambulatory Visit | Attending: Family Medicine | Admitting: Family Medicine

## 2019-12-01 ENCOUNTER — Ambulatory Visit (INDEPENDENT_AMBULATORY_CARE_PROVIDER_SITE_OTHER): Payer: Medicare Other | Admitting: Family Medicine

## 2019-12-01 ENCOUNTER — Other Ambulatory Visit (INDEPENDENT_AMBULATORY_CARE_PROVIDER_SITE_OTHER): Payer: Medicare Other

## 2019-12-01 ENCOUNTER — Encounter: Payer: Self-pay | Admitting: Family Medicine

## 2019-12-01 ENCOUNTER — Other Ambulatory Visit: Payer: Self-pay

## 2019-12-01 VITALS — BP 120/86 | HR 72 | Ht 67.0 in

## 2019-12-01 DIAGNOSIS — M2550 Pain in unspecified joint: Secondary | ICD-10-CM

## 2019-12-01 DIAGNOSIS — M545 Low back pain, unspecified: Secondary | ICD-10-CM

## 2019-12-01 DIAGNOSIS — M62830 Muscle spasm of back: Secondary | ICD-10-CM | POA: Insufficient documentation

## 2019-12-01 LAB — URINALYSIS, ROUTINE W REFLEX MICROSCOPIC
Bilirubin Urine: NEGATIVE
Hgb urine dipstick: NEGATIVE
Ketones, ur: NEGATIVE
Nitrite: NEGATIVE
RBC / HPF: NONE SEEN (ref 0–?)
Renal Epithel, UA: NONE SEEN
Specific Gravity, Urine: 1.015 (ref 1.000–1.030)
Total Protein, Urine: NEGATIVE
Urine Glucose: NEGATIVE
Urobilinogen, UA: 0.2 (ref 0.0–1.0)
pH: 6 (ref 5.0–8.0)

## 2019-12-01 LAB — CBC WITH DIFFERENTIAL/PLATELET
Basophils Absolute: 0.1 10*3/uL (ref 0.0–0.1)
Basophils Relative: 0.7 % (ref 0.0–3.0)
Eosinophils Absolute: 0.2 10*3/uL (ref 0.0–0.7)
Eosinophils Relative: 2.5 % (ref 0.0–5.0)
HCT: 43.2 % (ref 36.0–46.0)
Hemoglobin: 14.8 g/dL (ref 12.0–15.0)
Lymphocytes Relative: 17.9 % (ref 12.0–46.0)
Lymphs Abs: 1.4 10*3/uL (ref 0.7–4.0)
MCHC: 34.2 g/dL (ref 30.0–36.0)
MCV: 92.1 fl (ref 78.0–100.0)
Monocytes Absolute: 0.8 10*3/uL (ref 0.1–1.0)
Monocytes Relative: 9.9 % (ref 3.0–12.0)
Neutro Abs: 5.6 10*3/uL (ref 1.4–7.7)
Neutrophils Relative %: 69 % (ref 43.0–77.0)
Platelets: 228 10*3/uL (ref 150.0–400.0)
RBC: 4.68 Mil/uL (ref 3.87–5.11)
RDW: 13 % (ref 11.5–15.5)
WBC: 8.1 10*3/uL (ref 4.0–10.5)

## 2019-12-01 LAB — COMPREHENSIVE METABOLIC PANEL
ALT: 23 U/L (ref 0–35)
AST: 25 U/L (ref 0–37)
Albumin: 4.3 g/dL (ref 3.5–5.2)
Alkaline Phosphatase: 86 U/L (ref 39–117)
BUN: 23 mg/dL (ref 6–23)
CO2: 28 mEq/L (ref 19–32)
Calcium: 9.9 mg/dL (ref 8.4–10.5)
Chloride: 104 mEq/L (ref 96–112)
Creatinine, Ser: 0.9 mg/dL (ref 0.40–1.20)
GFR: 62.42 mL/min (ref >60.00)
Glucose, Bld: 91 mg/dL (ref 70–99)
Potassium: 3.7 mEq/L (ref 3.5–5.1)
Sodium: 139 mEq/L (ref 135–145)
Total Bilirubin: 0.6 mg/dL (ref 0.2–1.2)
Total Protein: 7.1 g/dL (ref 6.0–8.3)

## 2019-12-01 LAB — SEDIMENTATION RATE: Sed Rate: 12 mm/hr (ref 0–30)

## 2019-12-01 MED ORDER — KETOROLAC TROMETHAMINE 60 MG/2ML IM SOLN
60.0000 mg | Freq: Once | INTRAMUSCULAR | Status: AC
  Administered 2019-12-01: 60 mg via INTRAMUSCULAR

## 2019-12-01 MED ORDER — AMOXICILLIN-POT CLAVULANATE 875-125 MG PO TABS: 1.0000 | ORAL_TABLET | Freq: Two times a day (BID) | ORAL | 0 refills | Status: AC

## 2019-12-01 MED ORDER — PREDNISONE 50 MG PO TABS: ORAL_TABLET | ORAL | 0 refills | Status: DC

## 2019-12-01 MED ORDER — TIZANIDINE HCL 4 MG PO CAPS: ORAL_CAPSULE | ORAL | 0 refills | Status: DC

## 2019-12-01 MED ORDER — METHYLPREDNISOLONE ACETATE 80 MG/ML IJ SUSP
80.0000 mg | Freq: Once | INTRAMUSCULAR | Status: AC
  Administered 2019-12-01: 80 mg via INTRAMUSCULAR

## 2019-12-01 NOTE — Patient Instructions (Addendum)
  [address] 1st floor [place],  [address] Phone [phone]  Xray downstairs  Labs downstairs  Zanaflex at night Exercise 3 times a week Prednisone for the next 5 days Call us on Monday tell us how you feel

## 2019-12-01 NOTE — Assessment & Plan Note (Signed)
Likely more of a muscle spasm.  No radicular symptoms.  Concern for potentially secondary to the CVA tenderness possible infectious etiology even though history does not seem to be a.  Prednisone and muscle relaxer given but will get laboratory work-up.  X-rays ordered to rule out any type of fracture follow-up again in 4 to 8 weeks

## 2019-12-01 NOTE — Addendum Note (Signed)
Addended by: Lyndal Pulley on: 12/01/2019 12:49 PM   Modules accepted: Orders

## 2019-12-01 NOTE — Progress Notes (Signed)
[name] [address] [place], [place] [address] Phone: [phone] Subjective:   I Diane [name] am serving as a Education administrator for Dr. Hulan Saas.  This visit occurred during the SARS-CoV-2 public health emergency.  Safety protocols were in place, including screening questions prior to the visit, additional usage of staff PPE, and extensive cleaning of exam room while observing appropriate contact time as indicated for disinfecting solutions.   CC: Low back pain   [id number]   01/27/2019 Given injection.  Tolerated the procedure well.  Discussed icing regimen and home exercise.  Discussed which activities to do which was to avoid.  Discussed topical anti-inflammatories.  Discussed which activities.  Follow-up again in 4 to 8 weeks  12/01/2019 Diane [name] is a 68 y.o. female coming in with complaint of back pain. States the pain is tight. Couldn't walk for a few days. Doesn't really remember what caused the pain. Knee is doing ok. Swells at times.   Onset- Last Friday  Location - low back Character- tight, sharp Aggravating factors- any movements  Reliving factors-  Therapies tried- Ibuprofen, heat and ice  Severity-   6/10 at its worse      Past Medical History:  Diagnosis Date  . Allergy   . Chicken pox   . GERD (gastroesophageal reflux disease)   . Knee pain   . Seasonal allergic rhinitis   . Vitamin D deficiency    Past Surgical History:  Procedure Laterality Date  . COLONOSCOPY  2009  . [place], 2005  . wisdom teeth exraction     Social History   Socioeconomic History  . Marital status: Married    Spouse name: Not on file  . Number of children: Not on file  . Years of education: Not on file  . Highest education level: Not on file  Occupational History  . Not on file  Tobacco Use  . Smoking status: Never Smoker  . Smokeless tobacco: Never Used  Substance and Sexual Activity  . Alcohol use: No  . Drug use: No    . Sexual activity: Yes    Partners: Male  Other Topics Concern  . Not on file  Social History Narrative  . Not on file   Social Determinants of Health   Financial Resource Strain:   . Difficulty of Paying Living Expenses: Not on file  Food Insecurity:   . Worried About Charity fundraiser in the Last Year: Not on file  . Ran Out of Food in the Last Year: Not on file  Transportation Needs:   . Lack of Transportation (Medical): Not on file  . Lack of Transportation (Non-Medical): Not on file  Physical Activity:   . Days of Exercise per Week: Not on file  . Minutes of Exercise per Session: Not on file  Stress:   . Feeling of Stress : Not on file  Social Connections:   . Frequency of Communication with Friends and Family: Not on file  . Frequency of Social Gatherings with Friends and Family: Not on file  . Attends Religious Services: Not on file  . Active Member of Clubs or Organizations: Not on file  . Attends Archivist Meetings: Not on file  . Marital Status: Not on file   No Known Allergies Family History  Problem Relation Age of Onset  . Heart disease Mother   . Heart attack Mother   . Heart disease Father   . Aortic  aneurysm Father   . Dementia Father   . Colon cancer Neg Hx   . Stomach cancer Neg Hx   . Esophageal cancer Neg Hx   . Pancreatic cancer Neg Hx   . Rectal cancer Neg Hx     Current Outpatient Medications (Endocrine & Metabolic):  .  predniSONE (DELTASONE) 50 MG tablet, 1 tablet by mouth daily   Current Outpatient Medications (Respiratory):  .  fluticasone (FLONASE) 50 MCG/ACT nasal spray, Place 2 sprays into both nostrils daily. (Patient taking differently: Place 2 sprays into both nostrils daily as needed. )    Current Outpatient Medications (Other):  Marland Kitchen  Ascorbic Acid (VITAMIN C GUMMIES PO), Take 1 tablet by mouth daily. .  Biotin 1000 MCG tablet, Take 1,000 mcg by mouth daily. .  Cholecalciferol (VITAMIN D PO), Take by mouth. Marland Kitchen   glucosamine-chondroitin 500-400 MG tablet, Take 1 tablet by mouth daily. .  Multiple Vitamin (MULTIVITAMIN) tablet, Take 1 tablet by mouth daily. Marland Kitchen  tiZANidine (ZANAFLEX) 4 MG capsule, 1 tablet at night    Past medical history, social, surgical and family history all reviewed in electronic medical record.  No pertanent information unless stated regarding to the chief complaint.   Review of Systems:  No headache, visual changes, nausea, vomiting, diarrhea, constipation, dizziness, abdominal pain, skin rash, fevers, chills, night sweats, weight loss, swollen lymph nodes, body aches, joint swelling, chest pain, shortness of breath, mood changes.  Positive muscle aches  Objective  Blood pressure 120/86, pulse 72, height 5\' 7"  (1.702 m), SpO2 98 %.    General: No apparent distress alert and oriented x3 mood and affect normal, dressed appropriately.  HEENT: Pupils equal, extraocular movements intact  Respiratory: Patient's speak in full sentences and does not appear short of breath  Cardiovascular: No lower extremity edema, non tender, no erythema  Skin: Warm dry intact with no signs of infection or rash on extremities or on axial skeleton.  Abdomen: Soft nontender  Neuro: Cranial nerves II through XII are intact, neurovascularly intact in all extremities with 2+ DTRs and 2+ pulses.  Lymph: No lymphadenopathy of posterior or anterior cervical chain or axillae bilaterally.  Gait normal with good balance and coordination.  MSK:  tender with full range of motion and good stability and symmetric strength and tone of shoulders, elbows, wrist, hip and ankles bilaterally.  Mild arthritic changes Patient's knee arthritis is still fairly severe with some instability  Back exam has loss of lordosis.  Patient does have tightness with flexion extension.  Patient does have some CVA tenderness noted on the right greater than left.  Patient has no significant spinous process tenderness though noted.  Negative  straight leg test.  5-5 strength of the lower extremities.  Deep tendon reflexes intact    Impression and Recommendations:     This case required medical decision making of moderate complexity. The above documentation has been reviewed and is accurate and complete Diane Pulley, DO       Note: This dictation was prepared with Dragon dictation along with smaller phrase technology. Any transcriptional errors that result from this process are unintentional.

## 2019-12-19 ENCOUNTER — Telehealth: Payer: Self-pay

## 2019-12-19 ENCOUNTER — Other Ambulatory Visit: Payer: Self-pay

## 2019-12-19 ENCOUNTER — Encounter: Payer: Self-pay | Admitting: Internal Medicine

## 2019-12-19 ENCOUNTER — Ambulatory Visit (INDEPENDENT_AMBULATORY_CARE_PROVIDER_SITE_OTHER): Payer: Medicare PPO | Admitting: Internal Medicine

## 2019-12-19 VITALS — BP 152/86 | HR 82 | Temp 98.6°F | Resp 16 | Ht 67.0 in | Wt 174.0 lb

## 2019-12-19 DIAGNOSIS — R8290 Unspecified abnormal findings in urine: Secondary | ICD-10-CM | POA: Insufficient documentation

## 2019-12-19 DIAGNOSIS — M545 Low back pain, unspecified: Secondary | ICD-10-CM | POA: Insufficient documentation

## 2019-12-19 LAB — POCT URINALYSIS DIPSTICK
Bilirubin, UA: NEGATIVE
Blood, UA: NEGATIVE
Glucose, UA: NEGATIVE
Ketones, UA: NEGATIVE
Leukocytes, UA: NEGATIVE
Nitrite, UA: NEGATIVE
Protein, UA: NEGATIVE
Spec Grav, UA: 1.02 (ref 1.010–1.025)
Urobilinogen, UA: NEGATIVE E.U./dL — AB
pH, UA: 6 (ref 5.0–8.0)

## 2019-12-19 NOTE — Telephone Encounter (Signed)
Pt scheduled  

## 2019-12-19 NOTE — Assessment & Plan Note (Signed)
Abnormal urine color and odor with lower back pain Recent kidney infection Urine dip here normal - will send for culture, but doubt UTI, kidney infection Increase fluids No treatment needed - await culture

## 2019-12-19 NOTE — Progress Notes (Signed)
Subjective:    Patient ID: Diane [name], female    DOB: 1951/07/25, 69 y.o.   MRN: [id number]  HPI The patient is here for an acute visit.  Mid back pain started 12/11.   She had picked up a case of water early 12/11, and then yeard work that afternoon.  That night her back felt funny.  It was much worse the next day.  Saw Dr Tamala Julian the following week.  Concern for musculoskeletal vs possible kidney infection.  She had injections, muscle relxer and prednisone.  She had urine and lab work checked.  She was also prescribed amoxicillin.   Took all meds.  This past weekend started getting a nagging pain in lower back.    Urine funny color, but it improves with drinking more water.  Occasionally she has an odor.  She denies dysuria, hematuria or change in frequency or urgency.   Standing and sitting make is worse, she feels it worse with laying on one side.  She has not taken anything for it.     Medications and allergies reviewed with patient and updated if appropriate.  Patient Active Problem List   Diagnosis Date Noted  . Lumbar paraspinal muscle spasm 12/01/2019  . Hyperlipidemia 09/01/2019  . History of esophageal dilatation 08/31/2019  . Osteopenia 08/30/2019  . Cellulitis of right elbow 07/15/2018  . Acute bursitis of left shoulder 02/01/2018  . Acute bursitis of right shoulder 09/17/2017  . Degenerative arthritis of knee, bilateral 12/01/2016  . Costochondral chest pain 05/21/2015    Current Outpatient Medications on File Prior to Visit  Medication Sig Dispense Refill  . Ascorbic Acid (VITAMIN C GUMMIES PO) Take 1 tablet by mouth daily.    . Biotin 1000 MCG tablet Take 1,000 mcg by mouth daily.    . Cholecalciferol (VITAMIN D PO) Take by mouth.    . fluticasone (FLONASE) 50 MCG/ACT nasal spray Place 2 sprays into both nostrils daily. (Patient taking differently: Place 2 sprays into both nostrils daily as needed. ) 16 g 6  . glucosamine-chondroitin 500-400 MG tablet Take 1  tablet by mouth daily.    . Multiple Vitamin (MULTIVITAMIN) tablet Take 1 tablet by mouth daily.    Marland Kitchen tiZANidine (ZANAFLEX) 4 MG capsule 1 tablet at night 30 capsule 0   No current facility-administered medications on file prior to visit.    Past Medical History:  Diagnosis Date  . Allergy   . Chicken pox   . GERD (gastroesophageal reflux disease)   . Knee pain   . Seasonal allergic rhinitis   . Vitamin D deficiency     Past Surgical History:  Procedure Laterality Date  . COLONOSCOPY  2009  . [place], 2005  . wisdom teeth exraction      Social History   Socioeconomic History  . Marital status: Married    Spouse name: Not on file  . Number of children: Not on file  . Years of education: Not on file  . Highest education level: Not on file  Occupational History  . Not on file  Tobacco Use  . Smoking status: Never Smoker  . Smokeless tobacco: Never Used  Substance and Sexual Activity  . Alcohol use: No  . Drug use: No  . Sexual activity: Yes    Partners: Male  Other Topics Concern  . Not on file  Social History Narrative  . Not on file   Social Determinants of Health   Financial Resource Strain:   .  Difficulty of Paying Living Expenses: Not on file  Food Insecurity:   . Worried About Charity fundraiser in the Last Year: Not on file  . Ran Out of Food in the Last Year: Not on file  Transportation Needs:   . Lack of Transportation (Medical): Not on file  . Lack of Transportation (Non-Medical): Not on file  Physical Activity:   . Days of Exercise per Week: Not on file  . Minutes of Exercise per Session: Not on file  Stress:   . Feeling of Stress : Not on file  Social Connections:   . Frequency of Communication with Friends and Family: Not on file  . Frequency of Social Gatherings with Friends and Family: Not on file  . Attends Religious Services: Not on file  . Active Member of Clubs or Organizations: Not on file  . Attends Archivist  Meetings: Not on file  . Marital Status: Not on file    Family History  Problem Relation Age of Onset  . Heart disease Mother   . Heart attack Mother   . Heart disease Father   . Aortic aneurysm Father   . Dementia Father   . Colon cancer Neg Hx   . Stomach cancer Neg Hx   . Esophageal cancer Neg Hx   . Pancreatic cancer Neg Hx   . Rectal cancer Neg Hx     Review of Systems  Constitutional: Negative for chills and fever.  Gastrointestinal: Negative for abdominal pain, blood in stool, constipation, diarrhea (soft stools) and nausea.  Genitourinary: Positive for decreased urine volume (not new) and frequency (not new). Negative for dysuria and hematuria.       Urine is dark yellow, except if she drinks a lot; a little odor  Musculoskeletal: Positive for back pain.       Objective:   Vitals:   12/19/19 1530  BP: (!) 152/86  Pulse: 82  Resp: 16  Temp: 98.6 F (37 C)  SpO2: 96%   BP Readings from Last 3 Encounters:  12/19/19 (!) 152/86  12/01/19 120/86  08/31/19 (!) 146/84   Wt Readings from Last 3 Encounters:  12/19/19 174 lb (78.9 kg)  08/31/19 155 lb 6.4 oz (70.5 kg)  01/27/19 159 lb (72.1 kg)   Body mass index is 27.25 kg/m.   Physical Exam Constitutional:      General: She is not in acute distress.    Appearance: Normal appearance. She is not ill-appearing.  HENT:     Head: Normocephalic and atraumatic.  Abdominal:     General: Abdomen is flat. There is no distension.     Palpations: Abdomen is soft.     Tenderness: There is no abdominal tenderness. There is no right CVA tenderness, left CVA tenderness, guarding or rebound.  Musculoskeletal:        General: Tenderness (minimal tenderness in left lower back.  no tenderness along spine) present.  Skin:    General: Skin is warm and dry.  Neurological:     Mental Status: She is alert.            Assessment & Plan:    See Problem List for Assessment and Plan of chronic medical problems.     This visit occurred during the SARS-CoV-2 public health emergency.  Safety protocols were in place, including screening questions prior to the visit, additional usage of staff PPE, and extensive cleaning of exam room while observing appropriate contact time as indicated for disinfecting solutions.

## 2019-12-19 NOTE — Assessment & Plan Note (Signed)
Left lower back pain, likely MSK in nature Try tizanidine and advil Will check urine culture, but unlikely UTI/kidney infection She will call if no improvement

## 2019-12-19 NOTE — Patient Instructions (Addendum)
Your preliminary urine does not look like it is infected.  We will send it for a culture.     Take advil or the muscle relaxer for the pain.     Please call if there is no improvement in your symptoms.

## 2019-12-19 NOTE — Telephone Encounter (Signed)
Pt called stating she saw Dr. Tamala Julian 2-3 weeks ago for low back pain. She was originally treated for muscle spasm but Dr. Tamala Julian believe she had a kidney infection. Pt was put on an abx and was feeling better. She is now off the abx and the pain is back and worse. Can pt be seen today or does she need to come in & leave a urine sample?

## 2019-12-20 LAB — URINE CULTURE

## 2020-01-03 ENCOUNTER — Ambulatory Visit: Payer: Medicare PPO | Attending: Internal Medicine

## 2020-01-03 DIAGNOSIS — Z23 Encounter for immunization: Secondary | ICD-10-CM

## 2020-01-03 NOTE — Progress Notes (Signed)
   Covid-19 Vaccination Clinic  Name:  Diane [name]    MRN: [id number] DOB: June 23, 1951  01/03/2020  Ms. Chessor was observed post Covid-19 immunization for 15 minutes without incidence. She was provided with Vaccine Information Sheet and instruction to access the V-Safe system.   Ms. Yew was instructed to call 911 with any severe reactions post vaccine: Marland Kitchen Difficulty breathing  . Swelling of your face and throat  . A fast heartbeat  . A bad rash all over your body  . Dizziness and weakness    Immunizations Administered    Name Date Dose VIS Date Route   Pfizer COVID-19 Vaccine 01/03/2020  8:45 AM 0.3 mL 11/25/2019 Intramuscular   Manufacturer: [employer], [employer]   Lot: [id number]   [place]: [id number]

## 2020-01-24 ENCOUNTER — Ambulatory Visit: Payer: Medicare PPO | Attending: Internal Medicine

## 2020-01-24 DIAGNOSIS — Z23 Encounter for immunization: Secondary | ICD-10-CM | POA: Insufficient documentation

## 2020-01-24 NOTE — Progress Notes (Signed)
   Covid-19 Vaccination Clinic  Name:  TAYTEN CRITTENDEN    MRN: [id number] DOB: 1951/05/14  01/24/2020  Ms. Caul was observed post Covid-19 immunization for 15 minutes without incidence. She was provided with Vaccine Information Sheet and instruction to access the V-Safe system.   Ms. Gessert was instructed to call 911 with any severe reactions post vaccine: Marland Kitchen Difficulty breathing  . Swelling of your face and throat  . A fast heartbeat  . A bad rash all over your body  . Dizziness and weakness    Immunizations Administered    Name Date Dose VIS Date Route   Pfizer COVID-19 Vaccine 01/24/2020  8:33 AM 0.3 mL 11/25/2019 Intramuscular   Manufacturer: [place]   Lot: [id number]   [place]: [id number]

## 2020-03-14 DIAGNOSIS — Z1231 Encounter for screening mammogram for malignant neoplasm of breast: Secondary | ICD-10-CM | POA: Diagnosis not present

## 2020-03-21 ENCOUNTER — Encounter: Payer: Self-pay | Admitting: Gastroenterology

## 2020-04-02 ENCOUNTER — Ambulatory Visit (AMBULATORY_SURGERY_CENTER): Payer: Self-pay | Admitting: *Deleted

## 2020-04-02 ENCOUNTER — Other Ambulatory Visit: Payer: Self-pay

## 2020-04-02 VITALS — Temp 97.3°F | Ht 67.0 in | Wt 157.0 lb

## 2020-04-02 DIAGNOSIS — Z86010 Personal history of colonic polyps: Secondary | ICD-10-CM

## 2020-04-02 MED ORDER — NA SULFATE-K SULFATE-MG SULF 17.5-3.13-1.6 GM/177ML PO SOLN: ORAL | 0 refills | Status: DC

## 2020-04-02 NOTE — Progress Notes (Signed)
Patient is here in-person for PV. Patient denies any allergies to eggs or soy. Patient denies any problems with anesthesia/sedation. Patient denies any oxygen use at home. Patient denies taking any diet/weight loss medications or blood thinners. Patient is not being treated for MRSA or C-diff. Patient is aware of our care-partner policy and 0000000 safety protocol. covid vaccines completed on 01/24/20 per pt.

## 2020-04-16 ENCOUNTER — Ambulatory Visit (AMBULATORY_SURGERY_CENTER): Payer: Medicare PPO | Admitting: Gastroenterology

## 2020-04-16 ENCOUNTER — Encounter: Payer: Self-pay | Admitting: Gastroenterology

## 2020-04-16 ENCOUNTER — Other Ambulatory Visit: Payer: Self-pay

## 2020-04-16 VITALS — BP 115/59 | HR 68 | Temp 96.9°F | Resp 19 | Ht 67.0 in | Wt 157.0 lb

## 2020-04-16 DIAGNOSIS — Z1211 Encounter for screening for malignant neoplasm of colon: Secondary | ICD-10-CM | POA: Diagnosis not present

## 2020-04-16 DIAGNOSIS — K219 Gastro-esophageal reflux disease without esophagitis: Secondary | ICD-10-CM | POA: Diagnosis not present

## 2020-04-16 DIAGNOSIS — Z86010 Personal history of colonic polyps: Secondary | ICD-10-CM | POA: Diagnosis not present

## 2020-04-16 DIAGNOSIS — D120 Benign neoplasm of cecum: Secondary | ICD-10-CM | POA: Diagnosis not present

## 2020-04-16 MED ORDER — SODIUM CHLORIDE 0.9 % IV SOLN: 500.0000 mL | Freq: Once | INTRAVENOUS | Status: DC

## 2020-04-16 NOTE — Progress Notes (Signed)
Called to room to assist during endoscopic procedure.  Patient ID and intended procedure confirmed with present staff. Received instructions for my participation in the procedure from the performing physician.  

## 2020-04-16 NOTE — Op Note (Signed)
[facility] Patient Name: Diane [name] Procedure Date: 04/16/2020 2:32 PM MRN: [id number] Endoscopist: Remo Lipps P. Havery Moros , MD Age: 69 Referring MD:  Date of Birth: 29-May-1951 Gender: Female Account #: [id number] Procedure:                Colonoscopy Indications:              High risk colon cancer surveillance: Personal                            history of sessile serrated colon polyps removed                            01/2017 Medicines:                Monitored Anesthesia Care Procedure:                Pre-Anesthesia Assessment:                           - Prior to the procedure, a History and Physical                            was performed, and patient medications and                            allergies were reviewed. The patient's tolerance of                            previous anesthesia was also reviewed. The risks                            and benefits of the procedure and the sedation                            options and risks were discussed with the patient.                            All questions were answered, and informed consent                            was obtained. Prior Anticoagulants: The patient has                            taken no previous anticoagulant or antiplatelet                            agents. ASA Grade Assessment: II - A patient with                            mild systemic disease. After reviewing the risks                            and benefits, the patient was deemed in  satisfactory condition to undergo the procedure.                           After obtaining informed consent, the colonoscope                            was passed under direct vision. Throughout the                            procedure, the patient's blood pressure, pulse, and                            oxygen saturations were monitored continuously. The                            Colonoscope was introduced through the anus and                         advanced to the the cecum, identified by                            appendiceal orifice and ileocecal valve. The                            colonoscopy was performed without difficulty. The                            patient tolerated the procedure well. The quality                            of the bowel preparation was good. The ileocecal                            valve, appendiceal orifice, and rectum were                            photographed. Scope In: 2:41:18 PM Scope Out: 3:00:47 PM Scope Withdrawal Time: 0 hours 15 minutes 22 seconds  Total Procedure Duration: 0 hours 19 minutes 29 seconds  Findings:                 The perianal and digital rectal examinations were                            normal.                           A diminutive polyp was found in the cecum. The                            polyp was flat. The polyp was removed with a cold                            snare. Resection and retrieval were complete.  A few small-mouthed diverticula were found in the                            transverse colon and left colon.                           The exam was otherwise without abnormality. Rectum                            was small, hard to obtain good retroflexed views. Complications:            No immediate complications. Estimated blood loss:                            Minimal. Estimated Blood Loss:     Estimated blood loss was minimal. Impression:               - One diminutive polyp in the cecum, removed with a                            cold snare. Resected and retrieved.                           - Diverticulosis in the transverse colon and in the                            left colon.                           - The examination was otherwise normal. Recommendation:           - Patient has a contact number available for                            emergencies. The signs and symptoms of potential                             delayed complications were discussed with the                            patient. Return to normal activities tomorrow.                            Written discharge instructions were provided to the                            patient.                           - Resume previous diet.                           - Continue present medications.                           - Await pathology results. Remo Lipps P. Kellyann Ordway, MD 04/16/2020 3:04:32 PM This report  has been signed electronically.

## 2020-04-16 NOTE — Patient Instructions (Signed)
YOU HAD AN ENDOSCOPIC PROCEDURE TODAY AT THE [facility]:   Refer to the procedure report that was given to you for any specific questions about what was found during the examination.  If the procedure report does not answer your questions, please call your gastroenterologist to clarify.  If you requested that your care partner not be given the details of your procedure findings, then the procedure report has been included in a sealed envelope for you to review at your convenience later.  YOU SHOULD EXPECT: Some feelings of bloating in the abdomen. Passage of more gas than usual.  Walking can help get rid of the air that was put into your GI tract during the procedure and reduce the bloating. If you had a lower endoscopy (such as a colonoscopy or flexible sigmoidoscopy) you may notice spotting of blood in your stool or on the toilet paper. If you underwent a bowel prep for your procedure, you may not have a normal bowel movement for a few days.  Please Note:  You might notice some irritation and congestion in your nose or some drainage.  This is from the oxygen used during your procedure.  There is no need for concern and it should clear up in a day or so.  SYMPTOMS TO REPORT IMMEDIATELY:   Following lower endoscopy (colonoscopy or flexible sigmoidoscopy):  Excessive amounts of blood in the stool  Significant tenderness or worsening of abdominal pains  Swelling of the abdomen that is new, acute  Fever of 100F or higher   For urgent or emergent issues, a gastroenterologist can be reached at any hour by calling [phone]. Do not use MyChart messaging for urgent concerns.    DIET:  We do recommend a small meal at first, but then you may proceed to your regular diet.  Drink plenty of fluids but you should avoid alcoholic beverages for 24 hours.  MEDICATIONS: Continue present medications.  Please see handouts given to you by your recovery nurse.  ACTIVITY:  You should plan to  take it easy for the rest of today and you should NOT DRIVE or use heavy machinery until tomorrow (because of the sedation medicines used during the test).    FOLLOW UP: Our staff will call the number listed on your records 48-72 hours following your procedure to check on you and address any questions or concerns that you may have regarding the information given to you following your procedure. If we do not reach you, we will leave a message.  We will attempt to reach you two times.  During this call, we will ask if you have developed any symptoms of COVID 19. If you develop any symptoms (ie: fever, flu-like symptoms, shortness of breath, cough etc.) before then, please call [phone].  If you test positive for Covid 19 in the 2 weeks post procedure, please call and report this information to us.    If any biopsies were taken you will be contacted by phone or by letter within the next 1-3 weeks.  Please call us at [phone] if you have not heard about the biopsies in 3 weeks.   Thank you for allowing us to provide for your healthcare needs today.   SIGNATURES/CONFIDENTIALITY: You and/or your care partner have signed paperwork which will be entered into your electronic medical record.  These signatures attest to the fact that that the information above on your After Visit Summary has been reviewed and is understood.  Full responsibility of the   confidentiality of this discharge information lies with you and/or your care-partner. 

## 2020-04-16 NOTE — Progress Notes (Signed)
Pt's states no medical or surgical changes since previsit or office visit. 

## 2020-04-16 NOTE — Progress Notes (Signed)
Report given to PACU, vss 

## 2020-04-16 NOTE — Progress Notes (Signed)
[place]

## 2020-04-18 ENCOUNTER — Telehealth: Payer: Self-pay

## 2020-04-18 NOTE — Telephone Encounter (Signed)
  Follow up Call-  Call back number 04/16/2020  Post procedure Call Back phone  # [phone]  Permission to leave phone message Yes  Some recent data might be hidden     Patient questions:  Do you have a fever, pain , or abdominal swelling? No. Pain Score  0 *  Have you tolerated food without any problems? Yes.    Have you been able to return to your normal activities? Yes.    Do you have any questions about your discharge instructions: Diet   No. Medications  No. Follow up visit  No.  Do you have questions or concerns about your Care? No.  Actions: * If pain score is 4 or above: No action needed, pain <4.  1. Have you developed a fever since your procedure? no  2.   Have you had an respiratory symptoms (SOB or cough) since your procedure? no  3.   Have you tested positive for COVID 19 since your procedure no  4.   Have you had any family members/close contacts diagnosed with the COVID 19 since your procedure?  no   If yes to any of these questions please route to Joylene John, RN and Erenest Rasher, RN

## 2020-04-20 ENCOUNTER — Encounter: Payer: Self-pay | Admitting: Gastroenterology

## 2020-08-30 DIAGNOSIS — L821 Other seborrheic keratosis: Secondary | ICD-10-CM | POA: Diagnosis not present

## 2020-08-30 DIAGNOSIS — L570 Actinic keratosis: Secondary | ICD-10-CM | POA: Diagnosis not present

## 2020-08-30 DIAGNOSIS — L905 Scar conditions and fibrosis of skin: Secondary | ICD-10-CM | POA: Diagnosis not present

## 2020-08-30 DIAGNOSIS — D1801 Hemangioma of skin and subcutaneous tissue: Secondary | ICD-10-CM | POA: Diagnosis not present

## 2020-08-30 DIAGNOSIS — L819 Disorder of pigmentation, unspecified: Secondary | ICD-10-CM | POA: Diagnosis not present

## 2020-08-30 DIAGNOSIS — D229 Melanocytic nevi, unspecified: Secondary | ICD-10-CM | POA: Diagnosis not present

## 2020-08-30 DIAGNOSIS — L814 Other melanin hyperpigmentation: Secondary | ICD-10-CM | POA: Diagnosis not present

## 2020-08-30 DIAGNOSIS — Z85828 Personal history of other malignant neoplasm of skin: Secondary | ICD-10-CM | POA: Diagnosis not present

## 2020-09-07 ENCOUNTER — Encounter: Payer: Self-pay | Admitting: Internal Medicine

## 2020-09-29 ENCOUNTER — Ambulatory Visit: Payer: Medicare PPO | Attending: Internal Medicine

## 2020-09-29 ENCOUNTER — Other Ambulatory Visit: Payer: Self-pay

## 2020-09-29 DIAGNOSIS — Z23 Encounter for immunization: Secondary | ICD-10-CM

## 2020-09-29 NOTE — Progress Notes (Signed)
   Covid-19 Vaccination Clinic  Name:  Diane [name]    MRN: [id number] DOB: [birth year]  09/29/2020  [name] was observed post Covid-19 immunization for 15 minutes without incident. She was provided with Vaccine Information Sheet and instruction to access the V-Safe system.   [name] was instructed to call 911 with any severe reactions post vaccine: Marland Kitchen Difficulty breathing  . Swelling of face and throat  . A fast heartbeat  . A bad rash all over body  . Dizziness and weakness

## 2020-10-15 ENCOUNTER — Ambulatory Visit: Payer: Medicare PPO | Admitting: Family Medicine

## 2020-10-15 NOTE — Progress Notes (Deleted)
[facility] [address] [place] Phone: [phone] Subjective:    I'm seeing this patient by the request  of:  Binnie Rail, MD  CC: Neck pain  [id number]  Diane [name] is a 69 y.o. female coming in with complaint of ***  Onset-  Location Duration-  Character- Aggravating factors- Reliving factors-  Therapies tried-  Severity-     Past Medical History:  Diagnosis Date  . Allergy   . Chicken pox   . GERD (gastroesophageal reflux disease)   . Knee pain   . Seasonal allergic rhinitis   . Vitamin D deficiency    Past Surgical History:  Procedure Laterality Date  . COLONOSCOPY  2009, 02/09/2017  . [place], 2005  . POLYPECTOMY    . wisdom teeth exraction     Social History   Socioeconomic History  . Marital status: Married    Spouse name: Not on file  . Number of children: Not on file  . Years of education: Not on file  . Highest education level: Not on file  Occupational History  . Not on file  Tobacco Use  . Smoking status: Never Smoker  . Smokeless tobacco: Never Used  Vaping Use  . Vaping Use: Never used  Substance and Sexual Activity  . Alcohol use: No  . Drug use: No  . Sexual activity: Yes    Partners: Male  Other Topics Concern  . Not on file  Social History Narrative  . Not on file   Social Determinants of Health   Financial Resource Strain:   . Difficulty of Paying Living Expenses: Not on file  Food Insecurity:   . Worried About Charity fundraiser in the Last Year: Not on file  . Ran Out of Food in the Last Year: Not on file  Transportation Needs:   . Lack of Transportation (Medical): Not on file  . Lack of Transportation (Non-Medical): Not on file  Physical Activity:   . Days of Exercise per Week: Not on file  . Minutes of Exercise per Session: Not on file  Stress:   . Feeling of Stress : Not on file  Social Connections:   . Frequency of Communication with Friends  and Family: Not on file  . Frequency of Social Gatherings with Friends and Family: Not on file  . Attends Religious Services: Not on file  . Active Member of Clubs or Organizations: Not on file  . Attends Archivist Meetings: Not on file  . Marital Status: Not on file   No Known Allergies Family History  Problem Relation Age of Onset  . Heart disease Mother   . Heart attack Mother   . Heart disease Father   . Aortic aneurysm Father   . Dementia Father   . Colon cancer Neg Hx   . Stomach cancer Neg Hx   . Esophageal cancer Neg Hx   . Pancreatic cancer Neg Hx   . Rectal cancer Neg Hx   . Colon polyps Neg Hx         Current Outpatient Medications (Respiratory):  .  fluticasone (FLONASE) 50 MCG/ACT nasal spray, Place 2 sprays into both nostrils daily. (Patient taking differently: Place 2 sprays into both nostrils daily as needed. )       Current Outpatient Medications (Other):  Marland Kitchen  Ascorbic Acid (VITAMIN C GUMMIES PO), Take 1 tablet by mouth daily. .  Biotin 1000 MCG tablet,  Take 1,000 mcg by mouth daily. .  Cholecalciferol (VITAMIN D PO), Take by mouth. Marland Kitchen  glucosamine-chondroitin 500-400 MG tablet, Take 1 tablet by mouth daily. .  Multiple Vitamin (MULTIVITAMIN) tablet, Take 1 tablet by mouth daily.  Current Facility-Administered Medications (Other):  .  0.9 %  sodium chloride infusion   Reviewed prior external information including notes and imaging from  primary care provider As well as notes that were available from care everywhere and other healthcare systems.  Past medical history, social, surgical and family history all reviewed in electronic medical record.  No pertanent information unless stated regarding to the chief complaint.   Review of Systems:  No headache, visual changes, nausea, vomiting, diarrhea, constipation, dizziness, abdominal pain, skin rash, fevers, chills, night sweats, weight loss, swollen lymph nodes, body aches, joint swelling,  chest pain, shortness of breath, mood changes. POSITIVE muscle aches  Objective  There were no vitals taken for this visit.   General: No apparent distress alert and oriented x3 mood and affect normal, dressed appropriately.  HEENT: Pupils equal, extraocular movements intact  Respiratory: Patient's speak in full sentences and does not appear short of breath  Cardiovascular: No lower extremity edema, non tender, no erythema  Neuro: Cranial nerves II through XII are intact, neurovascularly intact in all extremities with 2+ DTRs and 2+ pulses.  Gait normal with good balance and coordination.  MSK:  Non tender with full range of motion and good stability and symmetric strength and tone of shoulders, elbows, wrist, hip, knee and ankles bilaterally.     Impression and Recommendations:     The above documentation has been reviewed and is accurate and complete Lyndal Pulley, DO

## 2020-11-26 ENCOUNTER — Ambulatory Visit: Payer: Medicare PPO | Admitting: Family Medicine

## 2020-11-26 ENCOUNTER — Ambulatory Visit: Payer: Self-pay

## 2020-11-26 ENCOUNTER — Ambulatory Visit (INDEPENDENT_AMBULATORY_CARE_PROVIDER_SITE_OTHER): Payer: Medicare PPO

## 2020-11-26 ENCOUNTER — Encounter: Payer: Self-pay | Admitting: Family Medicine

## 2020-11-26 ENCOUNTER — Other Ambulatory Visit: Payer: Self-pay

## 2020-11-26 VITALS — BP 118/72 | HR 81 | Ht 67.0 in

## 2020-11-26 DIAGNOSIS — M25561 Pain in right knee: Secondary | ICD-10-CM | POA: Diagnosis not present

## 2020-11-26 DIAGNOSIS — M25061 Hemarthrosis, right knee: Secondary | ICD-10-CM | POA: Diagnosis not present

## 2020-11-26 MED ORDER — TRAMADOL HCL 50 MG PO TABS: 50.0000 mg | ORAL_TABLET | Freq: Three times a day (TID) | ORAL | 0 refills | Status: AC | PRN

## 2020-11-26 NOTE — Assessment & Plan Note (Addendum)
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  Discussed with patient in great length.  Patient has x-rays pending.  Quickly reviewed normal secondary to the underlying arthritic changes difficult to see but will look at the femoral component.  Over read pending.  Knee immobilizer given.  Discussed icing regimen.  Patient will be following up with me again within the next week we will see how patient responds.  Possible advanced imaging may be warranted.  Patient is in agreement with the plan tramadol given.  Warned of potential side effects.

## 2020-11-26 NOTE — Progress Notes (Signed)
[place] Phone: [phone] Subjective:   Diane [name], am serving as a scribe for Dr. Hulan Saas. This visit occurred during the SARS-CoV-2 public health emergency.  Safety protocols were in place, including screening questions prior to the visit, additional usage of staff PPE, and extensive cleaning of exam room while observing appropriate contact time as indicated for disinfecting solutions.   I'm seeing this patient by the request  of:  Binnie Rail, MD  CC: Knee pain acute  LSL:[id number]  Diane [name] is a 69 y.o. female coming in with complaint of right knee pain.  Patient did fall.  Patient working on the knee.  Patient states incident this was on Saturday night and has had increasing swelling.  Difficulty with walking.  Rates the severity of pain is 7 out of 10.  Has been seen previously and has known arthritic changes.  Has not been seen for greater than a year.  Did respond fairly well to injections previously.  Denies any calf pain.  States that it is very uncomfortable at night.     Past Medical History:  Diagnosis Date  . Allergy   . Chicken pox   . GERD (gastroesophageal reflux disease)   . Knee pain   . Seasonal allergic rhinitis   . Vitamin D deficiency    Past Surgical History:  Procedure Laterality Date  . COLONOSCOPY  2009, 02/09/2017  . [place], 2005  . POLYPECTOMY    . wisdom teeth exraction     Social History   Socioeconomic History  . Marital status: Married    Spouse name: Not on file  . Number of children: Not on file  . Years of education: Not on file  . Highest education level: Not on file  Occupational History  . Not on file  Tobacco Use  . Smoking status: Never Smoker  . Smokeless tobacco: Never Used  Vaping Use  . Vaping Use: Never used  Substance and Sexual Activity  . Alcohol use: [name]  . Drug use: [name]  . Sexual activity: Yes    Partners: Male   Other Topics Concern  . Not on file  Social History Narrative  . Not on file   Social Determinants of Health   Financial Resource Strain: Not on file  Food Insecurity: Not on file  Transportation Needs: Not on file  Physical Activity: Not on file  Stress: Not on file  Social Connections: Not on file   [name] Known Allergies Family History  Problem Relation Age of Onset  . Heart disease Mother   . Heart attack Mother   . Heart disease Father   . Aortic aneurysm Father   . Dementia Father   . Colon cancer Neg Hx   . Stomach cancer Neg Hx   . Esophageal cancer Neg Hx   . Pancreatic cancer Neg Hx   . Rectal cancer Neg Hx   . Colon polyps Neg Hx         Current Outpatient Medications (Respiratory):  .  fluticasone (FLONASE) 50 MCG/ACT nasal spray, Place 2 sprays into both nostrils daily. (Patient taking differently: Place 2 sprays into both nostrils daily as needed.)   Current Outpatient Medications (Analgesics):  .  traMADol (ULTRAM) 50 MG tablet, Take 1 tablet (50 mg total) by mouth every 8 (eight) hours as needed for up to 5 days.     Current Outpatient Medications (Other):  .  Ascorbic Acid (VITAMIN C GUMMIES PO), Take 1 tablet by mouth daily. .  Biotin 1000 MCG tablet, Take 1,000 mcg by mouth daily. .  Cholecalciferol (VITAMIN D PO), Take by mouth. Marland Kitchen  glucosamine-chondroitin 500-400 MG tablet, Take 1 tablet by mouth daily. .  Multiple Vitamin (MULTIVITAMIN) tablet, Take 1 tablet by mouth daily.  Current Facility-Administered Medications (Other):  .  0.9 %  sodium chloride infusion   Reviewed prior external information including notes and imaging from  primary care provider As well as notes that were available from care everywhere and other healthcare systems.  Past medical history, social, surgical and family history all reviewed in electronic medical record.  [name] pertanent information unless stated regarding to the chief complaint.   Review of Systems:  [name]  headache, visual changes, nausea, vomiting, diarrhea, constipation, dizziness, abdominal pain, skin rash, fevers, chills, night sweats, weight loss, swollen lymph nodes, body aches, joint swelling, chest pain, shortness of breath, mood changes. POSITIVE muscle aches  Objective  Blood pressure 118/72, pulse 81, height 5\' 7"  (1.702 m), SpO2 98 %.   General: [name] apparent distress alert and oriented x3 mood and affect normal, dressed appropriately.  HEENT: Pupils equal, extraocular movements intact  Respiratory: Patient's speak in full sentences and does not appear short of breath  Cardiovascular: [name] lower extremity edema, non tender, [name] erythema  Severely antalgic.  Patient has a large effusion noted of the knee.  Moderately warm.  Does have decreasing flexion.  He does have some instability with valgus and varus force.  Procedure: Real-time Ultrasound Guided Injection of right knee Device: GE Logiq Q7 Ultrasound guided injection is preferred based studies that show increased duration, increased effect, greater accuracy, decreased procedural pain, increased response rate, and decreased cost with ultrasound guided versus blind injection.  Verbal informed consent obtained.  Time-out conducted.  Noted [name] overlying erythema, induration, or other signs of local infection.  Skin prepped in a sterile fashion.  Local anesthesia: Topical Ethyl chloride.  With sterile technique and under real time ultrasound guidance: With a 22-gauge 2 inch needle patient was injected with 4 cc of 0.5% Marcaine and aspirated 85 cc of frank blood  This was from a superior lateral approach.  Completed without difficulty  Pain immediately resolved suggesting accurate placement of the medication.  Advised to call if fevers/chills, erythema, induration, drainage, or persistent bleeding.  Impression: Technically successful ultrasound guided injection.    Impression and Recommendations:     The above documentation has been  reviewed and is accurate and complete Lyndal Pulley, DO

## 2020-11-26 NOTE — Patient Instructions (Signed)
See me again on Thursday or Monday Xray today Brace until I see you

## 2020-11-27 ENCOUNTER — Other Ambulatory Visit: Payer: Self-pay

## 2020-11-27 DIAGNOSIS — M25061 Hemarthrosis, right knee: Secondary | ICD-10-CM

## 2020-12-01 ENCOUNTER — Ambulatory Visit (INDEPENDENT_AMBULATORY_CARE_PROVIDER_SITE_OTHER): Payer: Medicare PPO

## 2020-12-01 ENCOUNTER — Other Ambulatory Visit: Payer: Self-pay

## 2020-12-01 DIAGNOSIS — M25061 Hemarthrosis, right knee: Secondary | ICD-10-CM

## 2020-12-01 DIAGNOSIS — W19XXXA Unspecified fall, initial encounter: Secondary | ICD-10-CM

## 2020-12-01 DIAGNOSIS — S83281A Other tear of lateral meniscus, current injury, right knee, initial encounter: Secondary | ICD-10-CM

## 2020-12-01 DIAGNOSIS — M25561 Pain in right knee: Secondary | ICD-10-CM | POA: Diagnosis not present

## 2020-12-01 DIAGNOSIS — M25461 Effusion, right knee: Secondary | ICD-10-CM

## 2020-12-03 ENCOUNTER — Encounter: Payer: Self-pay | Admitting: Family Medicine

## 2020-12-04 ENCOUNTER — Other Ambulatory Visit: Payer: Self-pay

## 2020-12-04 DIAGNOSIS — M25061 Hemarthrosis, right knee: Secondary | ICD-10-CM

## 2021-01-10 DIAGNOSIS — M1711 Unilateral primary osteoarthritis, right knee: Secondary | ICD-10-CM | POA: Diagnosis not present

## 2021-02-11 DIAGNOSIS — M25561 Pain in right knee: Secondary | ICD-10-CM | POA: Diagnosis not present

## 2021-02-13 ENCOUNTER — Telehealth: Payer: Self-pay | Admitting: Internal Medicine

## 2021-02-13 DIAGNOSIS — Z01818 Encounter for other preprocedural examination: Secondary | ICD-10-CM

## 2021-02-13 NOTE — Telephone Encounter (Signed)
Left message for patient. If she calls back please let her know we will wait and order everything on Monday when she comes in for the clearance visit. We will have plenty of time to get everything back to her surgeon that she will need prior to her knee replacement.

## 2021-02-13 NOTE — Telephone Encounter (Signed)
I guess she can get it done before -I am not sure what surgery she is having.  Having the blood work beforehand may not necessarily speed anything up and I am not sure if she needs any other testing done.

## 2021-02-13 NOTE — Telephone Encounter (Signed)
Patient called and was wondering if the following labs could be done CMP, CBC(inclides diff/plt) and Prothrombin time- INR. For her surgical clearance. She can be reached at [phone]

## 2021-02-17 NOTE — Progress Notes (Signed)
Subjective:    Patient ID: Diane [name], female    DOB: [birth year], 70 y.o.   MRN: [id number]   This visit occurred during the SARS-CoV-2 public health emergency.  Safety protocols were in place, including screening questions prior to the visit, additional usage of staff PPE, and extensive cleaning of exam room while observing appropriate contact time as indicated for disinfecting solutions.    HPI She is here for pre-operative clearance at the request of Dr Wynelle Link for right total knee replacement scheduled for 03/05/21.   She denies any personal or family history of problems with anesthesia or bleeding/blood clot problems.    She has no concerns.  She is not on any prescription medications and only taking vitamins or OTC medications.      She is exercising regularly - uses a rower machine 5-7 minutes.  With her exercise and daily activities she denies chest pain, palpitations, SOB and lightheadedness.       Medications and allergies reviewed with patient and updated if appropriate.  Patient Active Problem List   Diagnosis Date Noted  . Hemarthrosis, right knee 11/26/2020  . Lower back pain 12/19/2019  . Lumbar paraspinal muscle spasm 12/01/2019  . Hyperlipidemia 09/01/2019  . History of esophageal dilatation 08/31/2019  . Osteopenia 08/30/2019  . Cellulitis of right elbow 07/15/2018  . Acute bursitis of left shoulder 02/01/2018  . Acute bursitis of right shoulder 09/17/2017  . Degenerative arthritis of knee, bilateral 12/01/2016  . Costochondral chest pain 05/21/2015    Current Outpatient Medications on File Prior to Visit  Medication Sig Dispense Refill  . Ascorbic Acid (VITAMIN C GUMMIES PO) Take 1 tablet by mouth daily.    . Biotin 1000 MCG tablet Take 1,000 mcg by mouth daily.    . Cholecalciferol (VITAMIN D PO) Take by mouth.    . fluticasone (FLONASE) 50 MCG/ACT nasal spray Place 2 sprays into both nostrils daily. (Patient taking differently: Place 2 sprays  into both nostrils daily as needed.) 16 g 6  . glucosamine-chondroitin 500-400 MG tablet Take 1 tablet by mouth daily.    . Multiple Vitamin (MULTIVITAMIN) tablet Take 1 tablet by mouth daily.     No current facility-administered medications on file prior to visit.    Past Medical History:  Diagnosis Date  . Allergy   . Chicken pox   . GERD (gastroesophageal reflux disease)   . Knee pain   . Seasonal allergic rhinitis   . Vitamin D deficiency     Past Surgical History:  Procedure Laterality Date  . COLONOSCOPY  2009, 02/09/2017  . [place], 2005  . POLYPECTOMY    . wisdom teeth exraction      Social History   Socioeconomic History  . Marital status: Married    Spouse name: Not on file  . Number of children: Not on file  . Years of education: Not on file  . Highest education level: Not on file  Occupational History  . Not on file  Tobacco Use  . Smoking status: Never Smoker  . Smokeless tobacco: Never Used  Vaping Use  . Vaping Use: Never used  Substance and Sexual Activity  . Alcohol use: No  . Drug use: No  . Sexual activity: Yes    Partners: Male  Other Topics Concern  . Not on file  Social History Narrative  . Not on file   Social Determinants of Health   Financial Resource Strain: Not on file  Food  Insecurity: Not on file  Transportation Needs: Not on file  Physical Activity: Not on file  Stress: Not on file  Social Connections: Not on file    Family History  Problem Relation Age of Onset  . Heart disease Mother   . Heart attack Mother   . Heart disease Father   . Aortic aneurysm Father   . Dementia Father   . Colon cancer Neg Hx   . Stomach cancer Neg Hx   . Esophageal cancer Neg Hx   . Pancreatic cancer Neg Hx   . Rectal cancer Neg Hx   . Colon polyps Neg Hx     Review of Systems  Constitutional: Negative for chills and fever.  Eyes: Negative for visual disturbance.  Respiratory: Negative for cough, shortness of breath and  wheezing.   Cardiovascular: Negative for chest pain, palpitations and leg swelling.  Gastrointestinal: Negative for abdominal pain, blood in stool, constipation, diarrhea and nausea.       No gerd  Genitourinary: Negative for dysuria and hematuria.  Musculoskeletal: Positive for arthralgias.  Skin: Negative for rash.  Neurological: Negative for dizziness, light-headedness and headaches.       Objective:   Vitals:   02/18/21 0808  BP: 112/80  Pulse: 88  Temp: 98.1 F (36.7 C)  SpO2: 97%   Filed Weights   02/18/21 0808  Weight: 148 lb (67.1 kg)   Body mass index is 23.18 kg/m.  BP Readings from Last 3 Encounters:  02/18/21 112/80  11/26/20 118/72  04/16/20 (!) 115/59    Wt Readings from Last 3 Encounters:  02/18/21 148 lb (67.1 kg)  04/16/20 157 lb (71.2 kg)  04/02/20 157 lb (71.2 kg)     Physical Exam Constitutional: She appears well-developed and well-nourished. No distress.  HENT:  Head: Normocephalic and atraumatic.  Right Ear: External ear normal. Normal ear canal and TM Left Ear: External ear normal.  Normal ear canal and TM Mouth/Throat: Oropharynx is clear and moist.  Eyes: Conjunctivae normal.  Neck: Neck supple. No tracheal deviation present. No thyromegaly present.  No carotid bruit  Cardiovascular: Normal rate, regular rhythm and normal heart sounds.   No murmur heard.  No edema. Pulmonary/Chest: Effort normal and breath sounds normal. No respiratory distress. She has no wheezes. She has no rales.  Abdominal: Soft. She exhibits no distension. There is no tenderness.  Lymphadenopathy: She has no cervical adenopathy.  Skin: Skin is warm and dry. She is not diaphoretic.  Psychiatric: She has a normal mood and affect. Her behavior is normal.        Assessment & Plan:      See Problem List for Assessment and Plan of chronic medical problems.

## 2021-02-18 ENCOUNTER — Encounter: Payer: Self-pay | Admitting: Internal Medicine

## 2021-02-18 ENCOUNTER — Other Ambulatory Visit: Payer: Self-pay

## 2021-02-18 ENCOUNTER — Ambulatory Visit: Payer: Medicare PPO | Admitting: Internal Medicine

## 2021-02-18 VITALS — BP 112/80 | HR 88 | Temp 98.1°F | Ht 67.0 in | Wt 148.0 lb

## 2021-02-18 DIAGNOSIS — Z01818 Encounter for other preprocedural examination: Secondary | ICD-10-CM | POA: Insufficient documentation

## 2021-02-18 LAB — COMPREHENSIVE METABOLIC PANEL
ALT: 18 U/L (ref 0–35)
AST: 24 U/L (ref 0–37)
Albumin: 4.3 g/dL (ref 3.5–5.2)
Alkaline Phosphatase: 77 U/L (ref 39–117)
BUN: 13 mg/dL (ref 6–23)
CO2: 28 mEq/L (ref 19–32)
Calcium: 9.7 mg/dL (ref 8.4–10.5)
Chloride: 106 mEq/L (ref 96–112)
Creatinine, Ser: 0.68 mg/dL (ref 0.40–1.20)
GFR: 89.51 mL/min (ref >60.00)
Glucose, Bld: 89 mg/dL (ref 70–99)
Potassium: 3.9 mEq/L (ref 3.5–5.1)
Sodium: 141 mEq/L (ref 135–145)
Total Bilirubin: 0.6 mg/dL (ref 0.2–1.2)
Total Protein: 6.9 g/dL (ref 6.0–8.3)

## 2021-02-18 LAB — URINALYSIS, ROUTINE W REFLEX MICROSCOPIC
Bilirubin Urine: NEGATIVE
Hgb urine dipstick: NEGATIVE
Ketones, ur: NEGATIVE
Leukocytes,Ua: NEGATIVE
Nitrite: NEGATIVE
RBC / HPF: NONE SEEN (ref 0–?)
Specific Gravity, Urine: 1.01 (ref 1.000–1.030)
Total Protein, Urine: NEGATIVE
Urine Glucose: NEGATIVE
Urobilinogen, UA: 0.2 (ref 0.0–1.0)
WBC, UA: NONE SEEN (ref 0–?)
pH: 6 (ref 5.0–8.0)

## 2021-02-18 LAB — CBC WITH DIFFERENTIAL/PLATELET
Basophils Absolute: 0 10*3/uL (ref 0.0–0.1)
Basophils Relative: 0.7 % (ref 0.0–3.0)
Eosinophils Absolute: 0.2 10*3/uL (ref 0.0–0.7)
Eosinophils Relative: 4.1 % (ref 0.0–5.0)
HCT: 44.2 % (ref 36.0–46.0)
Hemoglobin: 15.1 g/dL — ABNORMAL HIGH (ref 12.0–15.0)
Lymphocytes Relative: 24 % (ref 12.0–46.0)
Lymphs Abs: 1.1 10*3/uL (ref 0.7–4.0)
MCHC: 34.1 g/dL (ref 30.0–36.0)
MCV: 91.4 fl (ref 78.0–100.0)
Monocytes Absolute: 0.7 10*3/uL (ref 0.1–1.0)
Monocytes Relative: 13.9 % — ABNORMAL HIGH (ref 3.0–12.0)
Neutro Abs: 2.7 10*3/uL (ref 1.4–7.7)
Neutrophils Relative %: 57.3 % (ref 43.0–77.0)
Platelets: 177 10*3/uL (ref 150.0–400.0)
RBC: 4.83 Mil/uL (ref 3.87–5.11)
RDW: 13.3 % (ref 11.5–15.5)
WBC: 4.7 10*3/uL (ref 4.0–10.5)

## 2021-02-18 LAB — PROTIME-INR
INR: 1 ratio (ref 0.8–1.0)
Prothrombin Time: 11.6 s (ref 9.6–13.1)

## 2021-02-18 LAB — HEMOGLOBIN A1C: Hgb A1c MFr Bld: 5.4 % (ref 4.6–6.5)

## 2021-02-18 NOTE — Assessment & Plan Note (Addendum)
Here for pre-op examination for R TKR by Dr Wynelle Link Overall very healthy w/o cardiac or respiratory disease, no diabetes No concerning symptoms or symptoms suggestive of the above Will check cbc, cmp, UA, a1c EKG today - NSR 76 bpm, normal EKG.  No significant change compared to EKG from 2017 Low risk for surgery - cleared for surgery w/o further evaluation than the above

## 2021-02-18 NOTE — Patient Instructions (Addendum)
  Blood work was ordered.     An EKG was done today.     Medications changes include :   none

## 2021-03-05 DIAGNOSIS — G8918 Other acute postprocedural pain: Secondary | ICD-10-CM | POA: Diagnosis not present

## 2021-03-05 DIAGNOSIS — M1711 Unilateral primary osteoarthritis, right knee: Secondary | ICD-10-CM | POA: Diagnosis not present

## 2021-03-07 DIAGNOSIS — M25561 Pain in right knee: Secondary | ICD-10-CM | POA: Diagnosis not present

## 2021-03-14 DIAGNOSIS — M25561 Pain in right knee: Secondary | ICD-10-CM | POA: Diagnosis not present

## 2021-03-19 DIAGNOSIS — M25561 Pain in right knee: Secondary | ICD-10-CM | POA: Diagnosis not present

## 2021-03-21 DIAGNOSIS — M25561 Pain in right knee: Secondary | ICD-10-CM | POA: Diagnosis not present

## 2021-03-26 DIAGNOSIS — M25561 Pain in right knee: Secondary | ICD-10-CM | POA: Diagnosis not present

## 2021-03-28 DIAGNOSIS — M25561 Pain in right knee: Secondary | ICD-10-CM | POA: Diagnosis not present

## 2021-04-02 DIAGNOSIS — M25561 Pain in right knee: Secondary | ICD-10-CM | POA: Diagnosis not present

## 2021-04-04 DIAGNOSIS — M25561 Pain in right knee: Secondary | ICD-10-CM | POA: Diagnosis not present

## 2021-04-09 DIAGNOSIS — Z471 Aftercare following joint replacement surgery: Secondary | ICD-10-CM | POA: Diagnosis not present

## 2021-04-09 DIAGNOSIS — Z96651 Presence of right artificial knee joint: Secondary | ICD-10-CM | POA: Diagnosis not present

## 2021-05-14 NOTE — Progress Notes (Signed)
[place] Phone: [phone] Subjective:   Diane Diane [name], am serving as a scribe for Dr. Hulan Saas. This visit occurred during the SARS-CoV-2 public health emergency.  Safety protocols were in place, including screening questions prior to the visit, additional usage of staff PPE, and extensive cleaning of exam room while observing appropriate contact time as indicated for disinfecting solutions.   I'm seeing this patient by the request  of:  Diane Rail, MD  CC: Low back pain  [id number]   11/26/2020 Patient does have more of a hemarthrosis.  Discussed with patient in great length.  Patient has x-rays pending.  Quickly reviewed normal secondary to the underlying arthritic changes difficult to see but will look at the femoral component.  Over read pending.  Knee immobilizer given.  Discussed icing regimen.  Patient will be following up with me again within the next week we will see how patient responds.  Possible advanced imaging may be warranted.  Patient is in agreement with the plan tramadol given.  Warned of potential side effects  Update 05/15/2021 AMPARO Diane [name] is a 71 y.o. female coming in with complaint of back pain. Last seen for R knee pain. Patient states for past few weeks she has pain in lower back that will switch from L to R sides. Using pillow behind back when seated. Denies any radiating symptoms.       Past Medical History:  Diagnosis Date  . Allergy   . Chicken pox   . GERD (gastroesophageal reflux disease)   . Knee pain   . Seasonal allergic rhinitis   . Vitamin D deficiency    Past Surgical History:  Procedure Laterality Date  . COLONOSCOPY  2009, 02/09/2017  . [place], 2005  . POLYPECTOMY    . wisdom teeth exraction     Social History   Socioeconomic History  . Marital status: Married    Spouse name: Not on file  . Number of children: Not on file  . Years of  education: Not on file  . Highest education level: Not on file  Occupational History  . Not on file  Tobacco Use  . Smoking status: Never Smoker  . Smokeless tobacco: Never Used  Vaping Use  . Vaping Use: Never used  Substance and Sexual Activity  . Alcohol use: Diane [name]  . Drug use: Diane [name]  . Sexual activity: Yes    Partners: Male  Other Topics Concern  . Not on file  Social History Narrative  . Not on file   Social Determinants of Health   Financial Resource Strain: Not on file  Food Insecurity: Not on file  Transportation Needs: Not on file  Physical Activity: Not on file  Stress: Not on file  Social Connections: Not on file   Diane [name] Known Allergies Family History  Problem Relation Age of Onset  . Heart disease Mother   . Heart attack Mother   . Heart disease Father   . Aortic aneurysm Father   . Dementia Father   . Colon cancer Neg Hx   . Stomach cancer Neg Hx   . Esophageal cancer Neg Hx   . Pancreatic cancer Neg Hx   . Rectal cancer Neg Hx   . Colon polyps Neg Hx       Current Outpatient Medications (Respiratory):  .  fluticasone (FLONASE) 50 MCG/ACT nasal spray, Place 2 sprays into both nostrils daily. (Patient taking  differently: Place 2 sprays into both nostrils daily as needed.)    Current Outpatient Medications (Other):  Marland Kitchen  Ascorbic Acid (VITAMIN C GUMMIES PO), Take 1 tablet by mouth daily. .  Biotin 1000 MCG tablet, Take 1,000 mcg by mouth daily. .  Cholecalciferol (VITAMIN D PO), Take by mouth. Marland Kitchen  glucosamine-chondroitin 500-400 MG tablet, Take 1 tablet by mouth daily. .  Multiple Vitamin (MULTIVITAMIN) tablet, Take 1 tablet by mouth daily.   Reviewed prior external information including notes and imaging from  primary care provider As well as notes that were available from care everywhere and other healthcare systems.  Past medical history, social, surgical and family history all reviewed in electronic medical record.  Diane [name] pertanent information unless  stated regarding to the chief complaint.   Review of Systems:  Diane [name] headache, visual changes, nausea, vomiting, diarrhea, constipation, dizziness, abdominal pain, skin rash, fevers, chills, night sweats, weight loss, swollen lymph nodes, body aches, joint swelling, chest pain, shortness of breath, mood changes. POSITIVE muscle aches, joint swelling   Objective  Blood pressure 102/72, pulse 82, height 5\' 7"  (1.702 m), weight 141 lb (64 kg), SpO2 98 %.   General: Diane [name] apparent distress alert and oriented x3 mood and affect normal, dressed appropriately.  HEENT: Pupils equal, extraocular movements intact  Respiratory: Patient's speak in full sentences and does not appear short of breath  Cardiovascular: Diane [name] lower extremity edema, non tender, Diane [name] erythema  Gait antalgic.  Patient is favoring the right leg.  He does not have full extension of the knee. MSK:   Right knee does have swelling from the knee replacement.  Lacks the last 3 degrees to 5 degrees of extension of the knee.  Patient has 90 degrees of flexion.  Mild warmness noted.   Impression and Recommendations:     The above documentation has been reviewed and is accurate and complete Diane Pulley, DO

## 2021-05-15 ENCOUNTER — Encounter: Payer: Self-pay | Admitting: Family Medicine

## 2021-05-15 ENCOUNTER — Ambulatory Visit: Payer: Medicare PPO | Admitting: Family Medicine

## 2021-05-15 ENCOUNTER — Other Ambulatory Visit: Payer: Self-pay

## 2021-05-15 ENCOUNTER — Ambulatory Visit (INDEPENDENT_AMBULATORY_CARE_PROVIDER_SITE_OTHER): Payer: Medicare PPO

## 2021-05-15 VITALS — BP 102/72 | HR 82 | Ht 67.0 in | Wt 141.0 lb

## 2021-05-15 DIAGNOSIS — M545 Low back pain, unspecified: Secondary | ICD-10-CM

## 2021-05-15 DIAGNOSIS — M2170 Unequal limb length (acquired), unspecified site: Secondary | ICD-10-CM | POA: Diagnosis not present

## 2021-05-15 NOTE — Assessment & Plan Note (Signed)
I believe the patient's lower back pain is secondary to more multifactorial.  Patient does have a physiologic leg length discrepancy since the knee replacement with patient being unable to straighten the knee still at this point.  I believe that this is contributing to some of the back pain.  We discussed a heel lift, home exercises and icing regimen.  We will get a back x-ray to rule out anything else that could be contributing.  Discussed core strengthening exercises.  Patient will follow up with me again 6 weeks

## 2021-05-15 NOTE — Assessment & Plan Note (Signed)
Hopefully this will be short-lived.  I do believe that this is secondary to more of the knee replacement and likely when patient improves her range of motion will be able to get rid of the heel lift.  We discussed other possibilities that will be helpful.  Discussed icing the knee.  We will monitor patient's back pain and get new x-rays.  Follow-up with me again in 4 to 6 weeks

## 2021-05-15 NOTE — Patient Instructions (Signed)
Xray today Heel lift, use another insole if uncomfortable See me again in 6 weeks

## 2021-06-03 ENCOUNTER — Telehealth: Payer: Self-pay | Admitting: *Deleted

## 2021-06-03 ENCOUNTER — Other Ambulatory Visit (HOSPITAL_BASED_OUTPATIENT_CLINIC_OR_DEPARTMENT_OTHER): Payer: Self-pay

## 2021-06-03 ENCOUNTER — Ambulatory Visit: Payer: Medicare PPO

## 2021-06-03 NOTE — Telephone Encounter (Signed)
Cancelled 6/24 booster at [place] wanted to be scheduled at [facility].

## 2021-06-05 DIAGNOSIS — Z01419 Encounter for gynecological examination (general) (routine) without abnormal findings: Secondary | ICD-10-CM | POA: Diagnosis not present

## 2021-06-05 DIAGNOSIS — Z1231 Encounter for screening mammogram for malignant neoplasm of breast: Secondary | ICD-10-CM | POA: Diagnosis not present

## 2021-06-06 ENCOUNTER — Ambulatory Visit: Payer: Medicare PPO | Attending: Internal Medicine

## 2021-06-06 DIAGNOSIS — Z23 Encounter for immunization: Secondary | ICD-10-CM

## 2021-06-06 NOTE — Progress Notes (Signed)
   Covid-19 Vaccination Clinic  Name:  WILENE PHARO    MRN: [id number] DOB: [birth year]  06/06/2021  Ms. Robards was observed post Covid-19 immunization for 15 minutes without incident. She was provided with Vaccine Information Sheet and instruction to access the V-Safe system.   Ms. Klahn was instructed to call 911 with any severe reactions post vaccine: Difficulty breathing  Swelling of face and throat  A fast heartbeat  A bad rash all over body  Dizziness and weakness   Immunizations Administered     Name Date Dose VIS Date Route   PFIZER Comrnaty([place]) Covid-19 Vaccine 06/06/2021 10:11 AM 0.3 mL 11/22/2020 Intramuscular   Manufacturer: [place]   Lot: [id number]   [place]: [phone]

## 2021-06-07 ENCOUNTER — Ambulatory Visit: Payer: Medicare PPO

## 2021-06-07 ENCOUNTER — Other Ambulatory Visit (HOSPITAL_BASED_OUTPATIENT_CLINIC_OR_DEPARTMENT_OTHER): Payer: Self-pay

## 2021-06-07 MED ORDER — COVID-19 MRNA VAC-TRIS(PFIZER) 30 MCG/0.3ML IM SUSP
INTRAMUSCULAR | 0 refills | Status: DC
  Filled 2021-06-07: qty 0.3, 1d supply, fill #0

## 2021-07-01 NOTE — Progress Notes (Signed)
[facility] [address] [place] Phone: [phone] Subjective:   I Diane [name] am serving as a Education administrator for Dr. Hulan Saas.  This visit occurred during the SARS-CoV-2 public health emergency.  Safety protocols were in place, including screening questions prior to the visit, additional usage of staff PPE, and extensive cleaning of exam room while observing appropriate contact time as indicated for disinfecting solutions.   I'm seeing this patient by the request  of:  Binnie Rail, MD  CC: Back pain follow-up  IRW:[id number]  05/15/2021 Diane [name] is a 69 y.o. female coming in with complaint of lumbar spine pain.  Patient was seen previously and did have what seemed to be more of a physiologic leg length discrepancy secondary to the knee replacement.  Still have difficulty straightening the leg all the leg.  Patient did have x-rays at last exam of the lumbar spine.  These were independently visualized by me.  Review today shows that there is degenerative disc disease nearly throughout the lumbar spine most prominent at L4-L5 moderate in severity.  Patient states she is doing well today. States once she started wearing insert in shoe she has had no problems.      Past Medical History:  Diagnosis Date   Allergy    Chicken pox    GERD (gastroesophageal reflux disease)    Knee pain    Seasonal allergic rhinitis    Vitamin D deficiency    Past Surgical History:  Procedure Laterality Date   COLONOSCOPY  2009, 02/09/2017   KNEE SURGERY  1995, 2005   POLYPECTOMY     wisdom teeth exraction     Social History   Socioeconomic History   Marital status: Married    Spouse name: Not on file   Number of children: Not on file   Years of education: Not on file   Highest education level: Not on file  Occupational History   Not on file  Tobacco Use   Smoking status: Never   Smokeless tobacco: Never  Vaping Use   Vaping Use: Never  used  Substance and Sexual Activity   Alcohol use: No   Drug use: No   Sexual activity: Yes    Partners: Male  Other Topics Concern   Not on file  Social History Narrative   Not on file   Social Determinants of Health   Financial Resource Strain: Not on file  Food Insecurity: Not on file  Transportation Needs: Not on file  Physical Activity: Not on file  Stress: Not on file  Social Connections: Not on file   No Known Allergies Family History  Problem Relation Age of Onset   Heart disease Mother    Heart attack Mother    Heart disease Father    Aortic aneurysm Father    Dementia Father    Colon cancer Neg Hx    Stomach cancer Neg Hx    Esophageal cancer Neg Hx    Pancreatic cancer Neg Hx    Rectal cancer Neg Hx    Colon polyps Neg Hx       Current Outpatient Medications (Respiratory):    fluticasone (FLONASE) 50 MCG/ACT nasal spray, Place 2 sprays into both nostrils daily. (Patient taking differently: Place 2 sprays into both nostrils daily as needed.)    Current Outpatient Medications (Other):    Ascorbic Acid (VITAMIN C GUMMIES PO), Take 1 tablet by mouth daily.   Biotin 1000 MCG tablet,  Take 1,000 mcg by mouth daily.   Cholecalciferol (VITAMIN D PO), Take by mouth.   COVID-19 mRNA Vac-TriS, Pfizer, SUSP injection, Inject into the muscle.   glucosamine-chondroitin 500-400 MG tablet, Take 1 tablet by mouth daily.   Multiple Vitamin (MULTIVITAMIN) tablet, Take 1 tablet by mouth daily.    Review of Systems:  No headache, visual changes, nausea, vomiting, diarrhea, constipation, dizziness, abdominal pain, skin rash, fevers, chills, night sweats, weight loss, swollen lymph nodes, body aches,  chest pain, shortness of breath, mood changes. POSITIVE muscle aches, joint swelling  Objective  Blood pressure 118/82, pulse 80, height 5\' 7"  (1.702 m), weight 144 lb (65.3 kg), SpO2 97 %.   General: No apparent distress alert and oriented x3 mood and affect normal,  dressed appropriately.  HEENT: Pupils equal, extraocular movements intact  Respiratory: Patient's speak in full sentences and does not appear short of breath  Cardiovascular: No lower extremity edema, non tender, no erythema  Gait antalgic MSK: Patient still has some swelling on the right knee replacement but patient only lacks last 5 degrees of extension and does have full 20 degrees of flexion. Back exam very mild tenderness to palpation but otherwise fairly unremarkable.  Mild loss of lordosis.  5 out of 5 strength in lower extremities.   Impression and Recommendations:     The above documentation has been reviewed and is accurate and complete Lyndal Pulley, DO

## 2021-07-03 ENCOUNTER — Ambulatory Visit: Payer: Medicare PPO | Admitting: Family Medicine

## 2021-07-03 ENCOUNTER — Other Ambulatory Visit: Payer: Self-pay

## 2021-07-03 ENCOUNTER — Encounter: Payer: Self-pay | Admitting: Family Medicine

## 2021-07-03 VITALS — BP 118/82 | HR 80 | Ht 67.0 in | Wt 144.0 lb

## 2021-07-03 DIAGNOSIS — M545 Low back pain, unspecified: Secondary | ICD-10-CM | POA: Diagnosis not present

## 2021-07-03 DIAGNOSIS — M2170 Unequal limb length (acquired), unspecified site: Secondary | ICD-10-CM | POA: Diagnosis not present

## 2021-07-03 NOTE — Patient Instructions (Addendum)
Good to see you Use lift for 3-4 weeks and try to discontinue HOKA or OOFOS sandals in house  See me when you need me

## 2021-07-03 NOTE — Assessment & Plan Note (Signed)
Degenerative disc disease at L4-L5.  Patient is doing much better after the heel lift.  Most likely secondary to the leg length discrepancy but has significant improvement in range of motion of the knee.  Follow-up with me again as needed

## 2021-07-25 ENCOUNTER — Other Ambulatory Visit (HOSPITAL_BASED_OUTPATIENT_CLINIC_OR_DEPARTMENT_OTHER): Payer: Self-pay

## 2021-08-09 ENCOUNTER — Ambulatory Visit (INDEPENDENT_AMBULATORY_CARE_PROVIDER_SITE_OTHER): Payer: Medicare PPO

## 2021-08-09 DIAGNOSIS — Z0000 Encounter for general adult medical examination without abnormal findings: Secondary | ICD-10-CM

## 2021-08-09 NOTE — Progress Notes (Addendum)
I connected with Diane [name] today by telephone and verified that I am speaking with the correct person using two identifiers. Location patient: home Location provider: work Persons participating in the virtual visit: patient, provider.   I discussed the limitations, risks, security and privacy concerns of performing an evaluation and management service by telephone and the availability of in person appointments. I also discussed with the patient that there may be a patient responsible charge related to this service. The patient expressed understanding and verbally consented to this telephonic visit.    Interactive audio and video telecommunications were attempted between this provider and patient, however failed, due to patient having technical difficulties OR patient did not have access to video capability.  We continued and completed visit with audio only.  Some vital signs may be absent or patient reported.   Time Spent with patient on telephone encounter: 30 minutes  Subjective:   Diane [name] is a 69 y.o. female who presents for an Initial Medicare Annual Wellness Visit.  Review of Systems     Cardiac Risk Factors include: advanced age (>86mn, >>43women);dyslipidemia;family history of premature cardiovascular disease     Objective:    There were no vitals filed for this visit. There is no height or weight on file to calculate BMI.  Advanced Directives 08/09/2021  Does Patient Have a Medical Advance Directive? Yes  Type of Advance Directive Living will  Does patient want to make changes to medical advance directive? No - Patient declined    Current Medications (verified) Outpatient Encounter Medications as of 08/09/2021  Medication Sig   Biotin 1000 MCG tablet Take 1,000 mcg by mouth daily.   Calcium Carb-Cholecalciferol (CALCIUM 500+D3 PO) Take by mouth.   Cholecalciferol (VITAMIN D PO) Take by mouth.   fluticasone (FLONASE) 50 MCG/ACT nasal spray Place 2 sprays into  both nostrils daily. (Patient taking differently: Place 2 sprays into both nostrils daily as needed.)   Multiple Vitamin (MULTIVITAMIN) tablet Take 1 tablet by mouth daily.   Ascorbic Acid (VITAMIN C GUMMIES PO) Take 1 tablet by mouth daily. (Patient not taking: Reported on 08/09/2021)   COVID-19 mRNA Vac-TriS, Pfizer, SUSP injection Inject into the muscle.   glucosamine-chondroitin 500-400 MG tablet Take 1 tablet by mouth daily. (Patient not taking: Reported on 08/09/2021)   No facility-administered encounter medications on file as of 08/09/2021.    Allergies (verified) Patient has no known allergies.   History: Past Medical History:  Diagnosis Date   Allergy    Chicken pox    GERD (gastroesophageal reflux disease)    Knee pain    Seasonal allergic rhinitis    Vitamin D deficiency    Past Surgical History:  Procedure Laterality Date   COLONOSCOPY  2009, 02/09/2017   KNEE SURGERY  1995, 2005   POLYPECTOMY     wisdom teeth exraction     Family History  Problem Relation Age of Onset   Heart disease Mother    Heart attack Mother    Heart disease Father    Aortic aneurysm Father    Dementia Father    Colon cancer Neg Hx    Stomach cancer Neg Hx    Esophageal cancer Neg Hx    Pancreatic cancer Neg Hx    Rectal cancer Neg Hx    Colon polyps Neg Hx    Social History   Socioeconomic History   Marital status: Married    Spouse name: Not on file   Number of children: Not on  file   Years of education: Not on file   Highest education level: Not on file  Occupational History   Not on file  Tobacco Use   Smoking status: Never   Smokeless tobacco: Never  Vaping Use   Vaping Use: Never used  Substance and Sexual Activity   Alcohol use: No   Drug use: No   Sexual activity: Yes    Partners: Male  Other Topics Concern   Not on file  Social History Narrative   Not on file   Social Determinants of Health   Financial Resource Strain: Low Risk    Difficulty of Paying  Living Expenses: Not hard at all  Food Insecurity: No Food Insecurity   Worried About Charity fundraiser in the Last Year: Never true   [place] in the Last Year: Never true  Transportation Needs: No Transportation Needs   Lack of Transportation (Medical): No   Lack of Transportation (Non-Medical): No  Physical Activity: Sufficiently Active   Days of Exercise per Week: 5 days   Minutes of Exercise per Session: 30 min  Stress: No Stress Concern Present   Feeling of Stress : Not at all  Social Connections: Socially Integrated   Frequency of Communication with Friends and Family: More than three times a week   Frequency of Social Gatherings with Friends and Family: More than three times a week   Attends Religious Services: More than 4 times per year   Active Member of [employer] or Organizations: Yes   Attends Music therapist: More than 4 times per year   Marital Status: Married    Tobacco Counseling Counseling given: Not Answered   Clinical Intake:  Pre-visit preparation completed: Yes  Pain : No/denies pain     Nutritional Risks: None Diabetes: No  How often do you need to have someone help you when you read instructions, pamphlets, or other written materials from your doctor or pharmacy?: 1 - Never What is the last grade level you completed in school?: 2 years at community college  Diabetic? no  Interpreter Needed?: No  Information entered by :: Lisette Abu, LPN   Activities of Daily Living In your present state of health, do you have any difficulty performing the following activities: 08/09/2021 02/18/2021  Hearing? N N  Vision? N N  Difficulty concentrating or making decisions? N N  Walking or climbing stairs? N N  Dressing or bathing? N N  Doing errands, shopping? N N  Preparing Food and eating ? N -  Using the Toilet? N -  In the past six months, have you accidently leaked urine? N -  Do you have problems with loss of bowel control? N -   Managing your Medications? N -  Managing your Finances? N -  Housekeeping or managing your Housekeeping? N -  Some recent data might be hidden    Patient Care Team: Binnie Rail, MD as PCP - General (Internal Medicine) Vanessa Kick, MD as Consulting Physician (Obstetrics and Gynecology)  Indicate any recent Medical Services you may have received from other than [facility] providers in the past year (date may be approximate).     Assessment:   This is a routine wellness examination for Diane [name].  Hearing/Vision screen Hearing Screening - Comments:: Patient denied any hearing difficulty. Vision Screening - Comments:: Patient uses glasses to read only.  Last eye exam done at [facility].  Dietary issues and exercise activities discussed: Current Exercise Habits: Home exercise  routine, Type of exercise: walking, Time (Minutes): 30, Frequency (Times/Week): 5, Weekly Exercise (Minutes/Week): 150, Intensity: Moderate, Exercise limited by: orthopedic condition(s)   Goals Addressed               This Visit's Progress     Patient Stated (pt-stated)        My goal is to keep my weight around 140 pounds.      Depression Screen PHQ 2/9 Scores 08/09/2021 02/18/2021 01/24/2019  PHQ - 2 Score 0 0 0    Fall Risk Fall Risk  08/09/2021  Falls in the past year? 0  Number falls in past yr: 0  Injury with Fall? 0  Risk for fall due to : No Fall Risks  Follow up Falls evaluation completed    [place]:  Any stairs in or around the home? Yes  If so, are there any without handrails? No  Home free of loose throw rugs in walkways, pet beds, electrical cords, etc? Yes  Adequate lighting in your home to reduce risk of falls? Yes   ASSISTIVE DEVICES UTILIZED TO PREVENT FALLS:  Life alert? No  Use of a cane, walker or w/c? No  Grab bars in the bathroom? No  Shower chair or bench in shower? Yes  Elevated toilet seat or a handicapped toilet? No   TIMED UP  AND GO:  Was the test performed? No .  Length of time to ambulate 10 feet: n/a sec.   Gait steady and fast without use of assistive device  Cognitive Function: Normal cognitive status assessed by direct observation by this Nurse Health Advisor. No abnormalities found.          Immunizations Immunization History  Administered Date(s) Administered   Fluad Quad(high Dose 65+) 09/22/2019, 09/07/2020   Influenza,inj,Quad PF,6+ Mos 10/08/2016, 09/22/2019   Influenza-Unspecified 10/09/2017, 08/30/2018   PFIZER Comirnaty(Gray Top)Covid-19 Tri-Sucrose Vaccine 06/06/2021   PFIZER(Purple Top)SARS-COV-2 Vaccination 01/03/2020, 01/24/2020, 09/29/2020   Pneumococcal Conjugate-13 09/07/2020   Tdap 07/15/2018   Zoster, Live 03/18/2014    TDAP status: Up to date  Flu Vaccine status: Up to date  Pneumococcal vaccine status: Due, Education has been provided regarding the importance of this vaccine. Advised may receive this vaccine at local pharmacy or Health Dept. Aware to provide a copy of the vaccination record if obtained from local pharmacy or Health Dept. Verbalized acceptance and understanding.  Covid-19 vaccine status: Completed vaccines  Qualifies for Shingles Vaccine? Yes   Zostavax completed Yes   Shingrix Completed?: No.    Education has been provided regarding the importance of this vaccine. Patient has been advised to call insurance company to determine out of pocket expense if they have not yet received this vaccine. Advised may also receive vaccine at local pharmacy or Health Dept. Verbalized acceptance and understanding.  Screening Tests Health Maintenance  Topic Date Due   Hepatitis C Screening  Never done   Zoster Vaccines- Shingrix (1 of 2) Never done   MAMMOGRAM  12/29/2019   DEXA SCAN  02/04/2021   INFLUENZA VACCINE  07/15/2021   PNA vac Low Risk Adult (2 of 2 - PPSV23) 09/07/2021   COLONOSCOPY (Pts 45-77yr Insurance coverage will need to be confirmed)  04/17/2027    TETANUS/TDAP  07/15/2028   COVID-19 Vaccine  Completed   HPV VACCINES  Aged Out    Health Maintenance  Health Maintenance Due  Topic Date Due   Hepatitis C Screening  Never done   Zoster Vaccines-  Shingrix (1 of 2) Never done   MAMMOGRAM  12/29/2019   DEXA SCAN  02/04/2021   INFLUENZA VACCINE  07/15/2021    Colorectal cancer screening: Type of screening: Colonoscopy. Completed 04/16/2020. Repeat every 7 years  Mammogram status: Completed 12/2020. Repeat every year  Bone Density status: Completed 02/04/2018. Results reflect: Bone density results: OSTEOPENIA. Repeat every 3 years.  Lung Cancer Screening: (Low Dose CT Chest recommended if Age 26-80 years, 30 pack-year currently smoking OR have quit w/in 15years.) does not qualify.   Lung Cancer Screening Referral: no  Additional Screening:  Hepatitis C Screening: does qualify; Completed no  Vision Screening: Recommended annual ophthalmology exams for early detection of glaucoma and other disorders of the eye. Is the patient up to date with their annual eye exam?  Yes  Who is the provider or what is the name of the office in which the patient attends annual eye exams? [facility] If pt is not established with a provider, would they like to be referred to a provider to establish care? No .   Dental Screening: Recommended annual dental exams for proper oral hygiene  Community Resource Referral / Chronic Care Management: CRR required this visit?  No   CCM required this visit?  No      Plan:     I have personally reviewed and noted the following in the patient's chart:   Medical and social history Use of alcohol, tobacco or illicit drugs  Current medications and supplements including opioid prescriptions. Patient is not currently taking opioid prescriptions. Functional ability and status Nutritional status Physical activity Advanced directives List of other physicians Hospitalizations, surgeries, and ER visits in  previous 12 months Vitals Screenings to include cognitive, depression, and falls Referrals and appointments  In addition, I have reviewed and discussed with patient certain preventive protocols, quality metrics, and best practice recommendations. A written personalized care plan for preventive services as well as general preventive health recommendations were provided to patient.     Diane Flow, LPN   [id number]   Nurse Notes:  There were no vitals filed for this visit. There is no height or weight on file to calculate BMI.

## 2021-08-11 NOTE — Progress Notes (Signed)
Medical screening examination/treatment/procedure(s) were performed by non-physician practitioner and as supervising physician I was immediately available for consultation/collaboration.  I agree with above. Ashland Osmer J Melvie Paglia, MD  

## 2021-09-02 DIAGNOSIS — Z08 Encounter for follow-up examination after completed treatment for malignant neoplasm: Secondary | ICD-10-CM | POA: Diagnosis not present

## 2021-09-02 DIAGNOSIS — L814 Other melanin hyperpigmentation: Secondary | ICD-10-CM | POA: Diagnosis not present

## 2021-09-02 DIAGNOSIS — D225 Melanocytic nevi of trunk: Secondary | ICD-10-CM | POA: Diagnosis not present

## 2021-09-02 DIAGNOSIS — L821 Other seborrheic keratosis: Secondary | ICD-10-CM | POA: Diagnosis not present

## 2021-09-02 DIAGNOSIS — L819 Disorder of pigmentation, unspecified: Secondary | ICD-10-CM | POA: Diagnosis not present

## 2021-09-02 DIAGNOSIS — L570 Actinic keratosis: Secondary | ICD-10-CM | POA: Diagnosis not present

## 2021-09-02 DIAGNOSIS — Z85828 Personal history of other malignant neoplasm of skin: Secondary | ICD-10-CM | POA: Diagnosis not present

## 2021-10-01 NOTE — Progress Notes (Signed)
Diane [name] [place] Phone: [phone] Subjective:   Diane [name], am serving as a scribe for Dr. Hulan Saas. This visit occurred during the SARS-CoV-2 public health emergency.  Safety protocols were in place, including screening questions prior to the visit, additional usage of staff PPE, and extensive cleaning of exam room while observing appropriate contact time as indicated for disinfecting solutions.   I'm seeing this patient by the request  of:  Binnie Rail, MD  CC: Right shoulder pain  [id number]  Diane [name] is a 70 y.o. female coming in with complaint of R shoulder pain. Last seen for back pain in July 2022. Patient states that she has been doing yard work and helping with other chores. Pain throughout entire joint. Advil is not helping. Painful when she is trying to sleep.  Reviewing patient's chart patient did have this problem previously approximately 4 years ago.  Did respond extremely well to an injection.     Past Medical History:  Diagnosis Date   Allergy    Chicken pox    GERD (gastroesophageal reflux disease)    Knee pain    Seasonal allergic rhinitis    Vitamin D deficiency    Past Surgical History:  Procedure Laterality Date   COLONOSCOPY  2009, 02/09/2017   KNEE SURGERY  1995, 2005   POLYPECTOMY     wisdom teeth exraction     Social History   Socioeconomic History   Marital status: Married    Spouse name: Not on file   Number of children: Not on file   Years of education: Not on file   Highest education level: Not on file  Occupational History   Not on file  Tobacco Use   Smoking status: Never   Smokeless tobacco: Never  Vaping Use   Vaping Use: Never used  Substance and Sexual Activity   Alcohol use: [name]   Drug use: [name]   Sexual activity: Yes    Partners: Male  Other Topics Concern   Not on file  Social History Narrative   Not on file   Social Determinants of  Health   Financial Resource Strain: Low Risk    Difficulty of Paying Living Expenses: Not hard at all  Food Insecurity: [name] Food Insecurity   Worried About Charity fundraiser in the Last Year: Never true   [employer] in the Last Year: Never true  Transportation Needs: [name] Transportation Needs   Lack of Transportation (Medical): [name]   Lack of Transportation (Non-Medical): [name]  Physical Activity: Sufficiently Active   Days of Exercise per Week: 5 days   Minutes of Exercise per Session: 30 min  Stress: [name] Stress Concern Present   Feeling of Stress : Not at all  Social Connections: Socially Integrated   Frequency of Communication with Friends and Family: More than three times a week   Frequency of Social Gatherings with Friends and Family: More than three times a week   Attends Religious Services: More than 4 times per year   Active Member of [employer] or Organizations: Yes   Attends Music therapist: More than 4 times per year   Marital Status: Married   [name] Known Allergies Family History  Problem Relation Age of Onset   Heart disease Mother    Heart attack Mother    Heart disease Father    Aortic aneurysm Father    Dementia Father  Colon cancer Neg Hx    Stomach cancer Neg Hx    Esophageal cancer Neg Hx    Pancreatic cancer Neg Hx    Rectal cancer Neg Hx    Colon polyps Neg Hx       Current Outpatient Medications (Respiratory):    fluticasone (FLONASE) 50 MCG/ACT nasal spray, Place 2 sprays into both nostrils daily. (Patient taking differently: Place 2 sprays into both nostrils daily as needed.)    Current Outpatient Medications (Other):    Ascorbic Acid (VITAMIN C GUMMIES PO), Take 1 tablet by mouth daily.   Biotin 1000 MCG tablet, Take 1,000 mcg by mouth daily.   Calcium Carb-Cholecalciferol (CALCIUM 500+D3 PO), Take by mouth.   Cholecalciferol (VITAMIN D PO), Take by mouth.   COVID-19 mRNA Vac-TriS, Pfizer, SUSP injection, Inject into the muscle.    glucosamine-chondroitin 500-400 MG tablet, Take 1 tablet by mouth daily.   Multiple Vitamin (MULTIVITAMIN) tablet, Take 1 tablet by mouth daily.   Reviewed prior external information including notes and imaging from  primary care provider As well as notes that were available from care everywhere and other healthcare systems.  Past medical history, social, surgical and family history all reviewed in electronic medical record.  [name] pertanent information unless stated regarding to the chief complaint.   Review of Systems:  [name] headache, visual changes, nausea, vomiting, diarrhea, constipation, dizziness, abdominal pain, skin rash, fevers, chills, night sweats, weight loss, swollen lymph nodes, body aches, joint swelling, chest pain, shortness of breath, mood changes. POSITIVE muscle aches  Objective  Blood pressure 122/82, pulse 76, height 5\' 7"  (1.702 m), weight 151 lb (68.5 kg), SpO2 98 %.   General: [name] apparent distress alert and oriented x3 mood and affect normal, dressed appropriately.  HEENT: Pupils equal, extraocular movements intact  Respiratory: Patient's speak in full sentences and does not appear short of breath  Cardiovascular: [name] lower extremity edema, non tender, [name] erythema  Gait normal with good balance and coordination.  MSK: Shoulder: Right Inspection reveals [name] abnormalities, atrophy or asymmetry. Palpation shows tenderness over the acromioclavicular joint and minorly over the posterior glenohumeral joint ROM is full in all planes passively. Rotator cuff strength normal throughout. signs of impingement with positive Neer and Hawkin's tests, but negative empty can sign. Speeds and Yergason's tests normal. [name] labral pathology noted with negative Obrien's, negative clunk and good stability. Positive crossover noted. [name] apprehension sign  MSK US performed of: Right This study was ordered, performed, and interpreted by Charlann Boxer D.O.  Shoulder:   Supraspinatus:  Appears  normal on long and transverse views, Bursal bulge seen with shoulder abduction on impingement view. Subscapularis:  Appears normal on long and transverse views. Positive bursa AC joint: Capsular distention noted. Glenohumeral Joint:  Appears normal with trace effusion Glenoid Labrum:  Intact without visualized tears. Biceps Tendon: Hypoechoic changes noted.  Impression: Subacromial bursitis acute on chronic  Procedure: Real-time Ultrasound Guided Injection of right glenohumeral joint Device: GE Logiq E  Ultrasound guided injection is preferred based studies that show increased duration, increased effect, greater accuracy, decreased procedural pain, increased response rate with ultrasound guided versus blind injection.  Verbal informed consent obtained.  Time-out conducted.  Noted [name] overlying erythema, induration, or other signs of local infection.  Skin prepped in a sterile fashion.  Local anesthesia: Topical Ethyl chloride.  With sterile technique and under real time ultrasound guidance:  Joint visualized.  23g 1  inch needle inserted posterior approach. Pictures taken for needle placement. Patient  did have injection of 2 cc of 1% lidocaine, 2 cc of 0.5% Marcaine, and 1.0 cc of Kenalog 40 mg/dL. Completed without difficulty  Pain immediately resolved suggesting accurate placement of the medication.  Advised to call if fevers/chills, erythema, induration, drainage, or persistent bleeding.  Images permanently stored and available for review in the ultrasound unit.  Impression: Technically successful ultrasound guided injection.  Procedure: Real-time Ultrasound Guided Injection of right acromioclavicular joint Device: GE Logiq Q7 Ultrasound guided injection is preferred based studies that show increased duration, increased effect, greater accuracy, decreased procedural pain, increased response rate, and decreased cost with ultrasound guided versus blind injection.  Verbal informed consent  obtained.  Time-out conducted.  Noted [name] overlying erythema, induration, or other signs of local infection.  Skin prepped in a sterile fashion.  Local anesthesia: Topical Ethyl chloride.  With sterile technique and under real time ultrasound guidance: With a 25-gauge half inch needle injected with 0.5 cc of 0.5% Marcaine and 0.5 cc of Kenalog 40 mg/mL Completed without difficulty  Pain immediately resolved suggesting accurate placement of the medication.  Advised to call if fevers/chills, erythema, induration, drainage, or persistent bleeding.  Impression: Technically successful ultrasound guided injection.    Impression and Recommendations:     The above documentation has been reviewed and is accurate and complete Lyndal Pulley, DO

## 2021-10-02 ENCOUNTER — Ambulatory Visit: Payer: Medicare PPO | Admitting: Family Medicine

## 2021-10-02 ENCOUNTER — Other Ambulatory Visit: Payer: Self-pay

## 2021-10-02 ENCOUNTER — Ambulatory Visit (INDEPENDENT_AMBULATORY_CARE_PROVIDER_SITE_OTHER): Payer: Medicare PPO

## 2021-10-02 ENCOUNTER — Encounter: Payer: Self-pay | Admitting: Family Medicine

## 2021-10-02 ENCOUNTER — Ambulatory Visit: Payer: Self-pay

## 2021-10-02 VITALS — BP 122/82 | HR 76 | Ht 67.0 in | Wt 151.0 lb

## 2021-10-02 DIAGNOSIS — M25511 Pain in right shoulder: Secondary | ICD-10-CM

## 2021-10-02 DIAGNOSIS — M7551 Bursitis of right shoulder: Secondary | ICD-10-CM

## 2021-10-02 DIAGNOSIS — M25519 Pain in unspecified shoulder: Secondary | ICD-10-CM | POA: Insufficient documentation

## 2021-10-02 NOTE — Patient Instructions (Signed)
Injections in shoulder and AC today Xray today Exercises See me again in 5-6 weeks

## 2021-10-02 NOTE — Assessment & Plan Note (Signed)
Repeat injection given today.  Patient does have some chronic tendinopathy noted.  Patient did also have a small effusion of the shoulder.  Discussed with patient about icing regimen, home exercises, which activities to do which wants to avoid.  Patient work with Product/process development scientist to learn these in greater detail.  Follow-up with me again 4 to 6 weeks and likely patient hopefully will be nearly pain-free at that time.

## 2021-10-02 NOTE — Assessment & Plan Note (Signed)
Injection given and patient tolerated the procedure well.  Discussed icing regimen discussed with patient about icing regimen as well.  Patient should do relatively well with the conservative therapy and work with Product/process development scientist.  X-rays are pending.  Patient does not have any significant weakness so should do relatively well with conservative therapy

## 2021-11-04 ENCOUNTER — Ambulatory Visit: Payer: Medicare PPO | Admitting: Family Medicine

## 2021-12-04 ENCOUNTER — Other Ambulatory Visit: Payer: Self-pay

## 2021-12-04 ENCOUNTER — Ambulatory Visit
Admission: EM | Admit: 2021-12-04 | Discharge: 2021-12-04 | Disposition: A | Discharge status: Home / Self Care | Payer: Medicare PPO | Attending: Internal Medicine | Admitting: Internal Medicine

## 2021-12-04 DIAGNOSIS — R051 Acute cough: Secondary | ICD-10-CM | POA: Diagnosis not present

## 2021-12-04 DIAGNOSIS — J069 Acute upper respiratory infection, unspecified: Secondary | ICD-10-CM | POA: Diagnosis not present

## 2021-12-04 MED ORDER — AMOXICILLIN 875 MG PO TABS: 875.0000 mg | ORAL_TABLET | Freq: Two times a day (BID) | ORAL | 0 refills | Status: AC

## 2021-12-04 MED ORDER — PREDNISONE 10 MG PO TABS: 20.0000 mg | ORAL_TABLET | Freq: Every day | ORAL | 0 refills | Status: AC

## 2021-12-04 NOTE — ED Provider Notes (Signed)
[facility]    CSN: [id number] Arrival date & time: 12/04/21  0834      History   Chief Complaint Chief Complaint  Patient presents with   Cough    HPI SAVEAH BAHAR is a 70 y.o. female.   Patient presents with nasal congestion and drainage as well as nonproductive cough that has been present for approximately 1 month.  She reports that she has had thick nasal drainage.  Denies any known sick contacts prior to symptoms starting.  Denies any known fevers at home.  Patient reports that she has taken over-the-counter Tylenol Cold and flu with temporary relief of symptoms.  She reports that cough only occurs due to drainage in her throat.  Denies ear pain, sore throat, nausea, vomiting, diarrhea, abdominal pain, body aches, chills, chest pain, shortness of breath.  Patient has not seen a doctor since symptoms started.   Cough  Past Medical History:  Diagnosis Date   Allergy    Chicken pox    GERD (gastroesophageal reflux disease)    Knee pain    Seasonal allergic rhinitis    Vitamin D deficiency     Patient Active Problem List   Diagnosis Date Noted   AC joint pain 10/02/2021   Leg length discrepancy 05/15/2021   Preop examination 02/18/2021   Hemarthrosis, right knee 11/26/2020   Lower back pain 12/19/2019   Lumbar paraspinal muscle spasm 12/01/2019   Hyperlipidemia 09/01/2019   History of esophageal dilatation 08/31/2019   Osteopenia 08/30/2019   Cellulitis of right elbow 07/15/2018   Acute bursitis of left shoulder 02/01/2018   Acute bursitis of right shoulder 09/17/2017   Degenerative arthritis of knee, bilateral 12/01/2016   Costochondral chest pain 05/21/2015    Past Surgical History:  Procedure Laterality Date   COLONOSCOPY  2009, 02/09/2017   [place], 2005   POLYPECTOMY     wisdom teeth exraction      OB History   No obstetric history on file.      Home Medications    Prior to Admission medications   Medication Sig  Start Date End Date Taking? Authorizing Provider  amoxicillin (AMOXIL) 875 MG tablet Take 1 tablet (875 mg total) by mouth 2 (two) times daily for 7 days. 12/04/21 12/11/21 Yes Nataliya Graig, Michele Rockers, FNP  predniSONE (DELTASONE) 10 MG tablet Take 2 tablets (20 mg total) by mouth daily for 5 days. 12/04/21 12/09/21 Yes Benita Boonstra, Hildred Alamin E, FNP  Ascorbic Acid (VITAMIN C GUMMIES PO) Take 1 tablet by mouth daily.    [provider]  Biotin 1000 MCG tablet Take 1,000 mcg by mouth daily.    [provider]  Calcium Carb-Cholecalciferol (CALCIUM 500+D3 PO) Take by mouth.    [provider]  Cholecalciferol (VITAMIN D PO) Take by mouth.    [provider]  COVID-19 mRNA Vac-TriS, Pfizer, SUSP injection Inject into the muscle. 06/06/21   Carlyle Basques, MD  fluticasone (FLONASE) 50 MCG/ACT nasal spray Place 2 sprays into both nostrils daily. Patient taking differently: Place 2 sprays into both nostrils daily as needed. 12/26/13   Brunetta Jeans, PA-C  glucosamine-chondroitin 500-400 MG tablet Take 1 tablet by mouth daily.    [provider]  Multiple Vitamin (MULTIVITAMIN) tablet Take 1 tablet by mouth daily.    [provider]    Family History Family History  Problem Relation Age of Onset   Heart disease Mother    Heart attack Mother    Heart  disease Father    Aortic aneurysm Father    Dementia Father    Colon cancer Neg Hx    Stomach cancer Neg Hx    Esophageal cancer Neg Hx    Pancreatic cancer Neg Hx    Rectal cancer Neg Hx    Colon polyps Neg Hx     Social History Social History   Tobacco Use   Smoking status: Never   Smokeless tobacco: Never  Vaping Use   Vaping Use: Never used  Substance Use Topics   Alcohol use: No   Drug use: No     Allergies   Patient has no known allergies.   Review of Systems Review of Systems Per HPI  Physical Exam Triage Vital Signs ED Triage Vitals [12/04/21 0848]  Enc Vitals Group     BP (!)  146/85     Pulse Rate (!) 108     Resp 18     Temp 99.1 F (37.3 C)     Temp Source Oral     SpO2 97 %     Weight      Height      Head Circumference      Peak Flow      Pain Score 0     Pain Loc      Pain Edu?      Excl. in [place]?    No data found.  Updated Vital Signs BP (!) 146/85 (BP Location: Left Arm)    Pulse (!) 108    Temp 99.1 F (37.3 C) (Oral)    Resp 18    SpO2 97%   Visual Acuity Right Eye Distance:   Left Eye Distance:   Bilateral Distance:    Right Eye Near:   Left Eye Near:    Bilateral Near:     Physical Exam Constitutional:      General: She is not in acute distress.    Appearance: Normal appearance. She is not toxic-appearing or diaphoretic.  HENT:     Head: Normocephalic and atraumatic.     Right Ear: Tympanic membrane and ear canal normal.     Left Ear: Tympanic membrane and ear canal normal.     Nose: Congestion present.     Mouth/Throat:     Mouth: Mucous membranes are moist.     Pharynx: No posterior oropharyngeal erythema.  Eyes:     Extraocular Movements: Extraocular movements intact.     Conjunctiva/sclera: Conjunctivae normal.     Pupils: Pupils are equal, round, and reactive to light.  Cardiovascular:     Rate and Rhythm: Normal rate and regular rhythm.     Pulses: Normal pulses.     Heart sounds: Normal heart sounds.  Pulmonary:     Effort: Pulmonary effort is normal. No respiratory distress.     Breath sounds: Normal breath sounds. No stridor. No wheezing, rhonchi or rales.  Abdominal:     General: Abdomen is flat. Bowel sounds are normal.     Palpations: Abdomen is soft.  Musculoskeletal:        General: Normal range of motion.     Cervical back: Normal range of motion.  Skin:    General: Skin is warm and dry.  Neurological:     General: No focal deficit present.     Mental Status: She is alert and oriented to person, place, and time. Mental status is at baseline.  Psychiatric:        Mood and Affect: Mood normal.  Behavior: Behavior normal.     UC Treatments / Results  Labs (all labs ordered are listed, but only abnormal results are displayed) Labs Reviewed - No data to display  EKG   Radiology No results found.  Procedures Procedures (including critical care time)  Medications Ordered in UC Medications - No data to display  Initial Impression / Assessment and Plan / UC Course  I have reviewed the triage vital signs and the nursing notes.  Pertinent labs & imaging results that were available during my care of the patient were reviewed by me and considered in my medical decision making (see chart for details).     No adventitious lung sounds on exam but suggested chest x-ray to patient due to persistent cough for 1 month.  Patient declined x-ray.  Risks associated with not doing x-ray were discussed with patient.  Will prescribe amoxicillin antibiotic due to persistent symptoms as well as low-dose and short course of prednisone steroid.  Do not think that viral testing is necessary given duration of symptoms.  Discussed strict return precautions.  Patient verbalized understanding and was agreeable with plan. Final Clinical Impressions(s) / UC Diagnoses   Final diagnoses:  Acute upper respiratory infection  Acute cough     Discharge Instructions      You have been prescribed an antibiotic and a steroid to help with upper respiratory infection.  Please follow-up if symptoms persist or worsen.    ED Prescriptions     Medication Sig Dispense Auth. Provider   amoxicillin (AMOXIL) 875 MG tablet Take 1 tablet (875 mg total) by mouth 2 (two) times daily for 7 days. 14 tablet [place], [place] E, [place]   predniSONE (DELTASONE) 10 MG tablet Take 2 tablets (20 mg total) by mouth daily for 5 days. 10 tablet Teodora Medici, [place]      PDMP not reviewed this encounter.   Teodora Medici, [place] 12/04/21 [phone]

## 2021-12-04 NOTE — ED Triage Notes (Signed)
Pt c/o nasal congestion with drainage, cough,   Denies sore throat, headache, ear ache, body aches or chills, nausea, vomiting, diarrhea, constipation.   Onset week of thanksgiving

## 2021-12-04 NOTE — Discharge Instructions (Signed)
You have been prescribed an antibiotic and a steroid to help with upper respiratory infection.  Please follow-up if symptoms persist or worsen.

## 2022-01-14 NOTE — Progress Notes (Signed)
Diane [name] [place] [address] [place] Phone: [phone] Subjective:   [name], am serving as a scribe for Dr. Hulan Saas. This visit occurred during the SARS-CoV-2 public health emergency.  Safety protocols were in place, including screening questions prior to the visit, additional usage of staff PPE, and extensive cleaning of exam room while observing appropriate contact time as indicated for disinfecting solutions.   I'm seeing this patient by the request  of:  Binnie Rail, MD  CC: Right shoulder pain  [id number]  10/02/2021 Injection given and patient tolerated the procedure well.  Discussed icing regimen discussed with patient about icing regimen as well.  Patient should do relatively well with the conservative therapy and work with Product/process development scientist.  X-rays are pending.  Patient does not have any significant weakness so should do relatively well with conservative therapy  Repeat injection given today.  Patient does have some chronic tendinopathy noted.  Patient did also have a small effusion of the shoulder.  Discussed with patient about icing regimen, home exercises, which activities to do which wants to avoid.  Patient work with Product/process development scientist to learn these in greater detail.  Follow-up with me again 4 to 6 weeks and likely patient hopefully will be nearly pain-free at that time.  Update 01/15/2022 Diane [name] is a 71 y.o. female coming in with complaint of R shoulder pain. Patient states injection did help. Original pain from before where it radiates into neck and elbow.  Xray 10/02/2021 R shoulder IMPRESSION: 1. Mild acromioclavicular and glenohumeral osteoarthritis. Tiny subacromial spur. 2. Subcortical cystic change in the lateral humeral head suggesting underlying rotator cuff pathology.    Past Medical History:  Diagnosis Date   Allergy    Chicken pox    GERD (gastroesophageal reflux disease)    Knee pain     Seasonal allergic rhinitis    Vitamin D deficiency    Past Surgical History:  Procedure Laterality Date   COLONOSCOPY  2009, 02/09/2017   KNEE SURGERY  1995, 2005   POLYPECTOMY     wisdom teeth exraction     Social History   Socioeconomic History   Marital status: Married    Spouse name: Not on file   Number of children: Not on file   Years of education: Not on file   Highest education level: Not on file  Occupational History   Not on file  Tobacco Use   Smoking status: Never   Smokeless tobacco: Never  Vaping Use   Vaping Use: Never used  Substance and Sexual Activity   Alcohol use: No   Drug use: No   Sexual activity: Yes    Partners: Male  Other Topics Concern   Not on file  Social History Narrative   Not on file   Social Determinants of Health   Financial Resource Strain: Low Risk    Difficulty of Paying Living Expenses: Not hard at all  Food Insecurity: No Food Insecurity   Worried About Charity fundraiser in the Last Year: Never true   [place] in the Last Year: Never true  Transportation Needs: No Transportation Needs   Lack of Transportation (Medical): No   Lack of Transportation (Non-Medical): No  Physical Activity: Sufficiently Active   Days of Exercise per Week: 5 days   Minutes of Exercise per Session: 30 min  Stress: No Stress Concern Present   Feeling of Stress : Not at all  Social Connections: Engineer, building services of Communication with Friends and Family: More than three times a week   Frequency of Social Gatherings with Friends and Family: More than three times a week   Attends Religious Services: More than 4 times per year   Active Member of [employer] or Organizations: Yes   Attends Music therapist: More than 4 times per year   Marital Status: Married   No Known Allergies Family History  Problem Relation Age of Onset   Heart disease Mother    Heart attack Mother    Heart disease Father    Aortic aneurysm  Father    Dementia Father    Colon cancer Neg Hx    Stomach cancer Neg Hx    Esophageal cancer Neg Hx    Pancreatic cancer Neg Hx    Rectal cancer Neg Hx    Colon polyps Neg Hx       Current Outpatient Medications (Respiratory):    fluticasone (FLONASE) 50 MCG/ACT nasal spray, Place 2 sprays into both nostrils daily. (Patient taking differently: Place 2 sprays into both nostrils daily as needed.)    Current Outpatient Medications (Other):    Ascorbic Acid (VITAMIN C GUMMIES PO), Take 1 tablet by mouth daily.   Biotin 1000 MCG tablet, Take 1,000 mcg by mouth daily.   Calcium Carb-Cholecalciferol (CALCIUM 500+D3 PO), Take by mouth.   Cholecalciferol (VITAMIN D PO), Take by mouth.   COVID-19 mRNA Vac-TriS, Pfizer, SUSP injection, Inject into the muscle.   glucosamine-chondroitin 500-400 MG tablet, Take 1 tablet by mouth daily.   Multiple Vitamin (MULTIVITAMIN) tablet, Take 1 tablet by mouth daily.   Reviewed prior external information including notes and imaging from  primary care provider As well as notes that were available from care everywhere and other healthcare systems.  Past medical history, social, surgical and family history all reviewed in electronic medical record.  No pertanent information unless stated regarding to the chief complaint.   Review of Systems:  No headache, visual changes, nausea, vomiting, diarrhea, constipation, dizziness, abdominal pain, skin rash, fevers, chills, night sweats, weight loss, swollen lymph nodes, body aches, joint swelling, chest pain, shortness of breath, mood changes. POSITIVE muscle aches  Objective  Blood pressure 130/80, pulse 87, height 5\' 7"  (1.702 m), weight 152 lb (68.9 kg), SpO2 97 %.   General: No apparent distress alert and oriented x3 mood and affect normal, dressed appropriately.  HEENT: Pupils equal, extraocular movements intact  Respiratory: Patient's speak in full sentences and does not appear short of breath   Cardiovascular: No lower extremity edema, non tender, no erythema  Gait normal with good balance and coordination.  MSK: Right shoulder exam does show the patient does have some tenderness to palpation over the lateral aspect of the shoulder. Positive impingement noted.  Rotator cuff strength 4-5 with the supraspinatus and the infraspinatus and +5 with the subscapularis  Procedure: Real-time Ultrasound Guided Injection of right glenohumeral joint Device: GE Logiq Q7  Ultrasound guided injection is preferred based studies that show increased duration, increased effect, greater accuracy, decreased procedural pain, increased response rate with ultrasound guided versus blind injection.  Verbal informed consent obtained.  Time-out conducted.  Noted no overlying erythema, induration, or other signs of local infection.  Skin prepped in a sterile fashion.  Local anesthesia: Topical Ethyl chloride.  With sterile technique and under real time ultrasound guidance:  Joint visualized.  23g 1  inch needle inserted posterior approach. Pictures taken for needle  placement. Patient did have injection of 2 cc of 1% lidocaine, 2 cc of 0.5% Marcaine, and 1.0 cc of Kenalog 40 mg/dL. Completed without difficulty  Pain immediately resolved suggesting accurate placement of the medication.  Advised to call if fevers/chills, erythema, induration, drainage, or persistent bleeding.  Impression: Technically successful ultrasound guided injection.  Procedure: Real-time Ultrasound Guided Injection of right acromioclavicular joint Device: GE Logiq Q7 Ultrasound guided injection is preferred based studies that show increased duration, increased effect, greater accuracy, decreased procedural pain, increased response rate, and decreased cost with ultrasound guided versus blind injection.  Verbal informed consent obtained.  Time-out conducted.  Noted no overlying erythema, induration, or other signs of local infection.  Skin  prepped in a sterile fashion.  Local anesthesia: Topical Ethyl chloride.  With sterile technique and under real time ultrasound guidance: With a 25-gauge half inch needle injected with 0.5 cc of 0.5% Marcaine and 0.5 cc of Kenalog 40 mg/mL Completed without difficulty  Pain immediately resolved suggesting accurate placement of the medication.  Advised to call if fevers/chills, erythema, induration, drainage, or persistent bleeding.  Impression: Technically successful ultrasound guided injection.   Impression and Recommendations:     The above documentation has been reviewed and is accurate and complete Lyndal Pulley, DO

## 2022-01-15 ENCOUNTER — Other Ambulatory Visit: Payer: Self-pay

## 2022-01-15 ENCOUNTER — Encounter: Payer: Self-pay | Admitting: Family Medicine

## 2022-01-15 ENCOUNTER — Ambulatory Visit: Payer: Medicare PPO | Admitting: Family Medicine

## 2022-01-15 ENCOUNTER — Ambulatory Visit: Payer: Self-pay

## 2022-01-15 VITALS — BP 130/80 | HR 87 | Ht 67.0 in | Wt 152.0 lb

## 2022-01-15 DIAGNOSIS — M75111 Incomplete rotator cuff tear or rupture of right shoulder, not specified as traumatic: Secondary | ICD-10-CM

## 2022-01-15 DIAGNOSIS — M7551 Bursitis of right shoulder: Secondary | ICD-10-CM | POA: Diagnosis not present

## 2022-01-15 DIAGNOSIS — M25511 Pain in right shoulder: Secondary | ICD-10-CM

## 2022-01-15 NOTE — Patient Instructions (Addendum)
Injections today Restart exercises Monday Send message in 2 weeks if not better See me in 5 weeks

## 2022-01-15 NOTE — Assessment & Plan Note (Signed)
Patient given a repeat injection again today.  Did have some swelling noted as well.  Increase activity slowly.  Follow-up again in 5 weeks.  Worsening pain advanced imaging would be warranted with patient failing all conservative therapy.

## 2022-01-15 NOTE — Assessment & Plan Note (Signed)
Patient given injection today.  This seems to be an acute tear.  Discussed with patient that this could be worsenign and will need to watch for weakness.  If so advanced imaging would be warranted.  Follow-up with me again in 5 weeks

## 2022-02-06 NOTE — Progress Notes (Signed)
[facility] [place] [address] [place] Phone: [phone] Subjective:   [name], am serving as a scribe for Dr. Hulan Saas. This visit occurred during the SARS-CoV-2 public health emergency.  Safety protocols were in place, including screening questions prior to the visit, additional usage of staff PPE, and extensive cleaning of exam room while observing appropriate contact time as indicated for disinfecting solutions.   I'm seeing this patient by the request  of:  Binnie Rail, MD  CC: Right shoulder pain  [id number]  01/15/2022 Patient given a repeat injection again today.  Did have some swelling noted as well.  Increase activity slowly.  Follow-up again in 5 weeks.  Worsening pain advanced imaging would be warranted with patient failing all conservative therapy.  Patient given injection today.  This seems to be an acute tear.  Discussed with patient that this could be worsenign and will need to watch for weakness.  If so advanced imaging would be warranted.  Follow-up with me again in 5 weeks   Update 02/07/2022 Diane [name] is a 71 y.o. female coming in with complaint of R shoulder pain. Patient states still having pain. Advil is helping. Pain is running to neck and down to elbow. When exercising can feel and hear grinding.      Past Medical History:  Diagnosis Date   Allergy    Chicken pox    GERD (gastroesophageal reflux disease)    Knee pain    Seasonal allergic rhinitis    Vitamin D deficiency    Past Surgical History:  Procedure Laterality Date   COLONOSCOPY  2009, 02/09/2017   KNEE SURGERY  1995, 2005   POLYPECTOMY     wisdom teeth exraction     Social History   Socioeconomic History   Marital status: Married    Spouse name: Not on file   Number of children: Not on file   Years of education: Not on file   Highest education level: Not on file  Occupational History   Not on file  Tobacco Use   Smoking  status: Never   Smokeless tobacco: Never  Vaping Use   Vaping Use: Never used  Substance and Sexual Activity   Alcohol use: No   Drug use: No   Sexual activity: Yes    Partners: Male  Other Topics Concern   Not on file  Social History Narrative   Not on file   Social Determinants of Health   Financial Resource Strain: Low Risk    Difficulty of Paying Living Expenses: Not hard at all  Food Insecurity: No Food Insecurity   Worried About Charity fundraiser in the Last Year: Never true   [place] in the Last Year: Never true  Transportation Needs: No Transportation Needs   Lack of Transportation (Medical): No   Lack of Transportation (Non-Medical): No  Physical Activity: Sufficiently Active   Days of Exercise per Week: 5 days   Minutes of Exercise per Session: 30 min  Stress: No Stress Concern Present   Feeling of Stress : Not at all  Social Connections: Socially Integrated   Frequency of Communication with Friends and Family: More than three times a week   Frequency of Social Gatherings with Friends and Family: More than three times a week   Attends Religious Services: More than 4 times per year   Active Member of [employer] or Organizations: Yes   Attends Archivist  Meetings: More than 4 times per year   Marital Status: Married   No Known Allergies Family History  Problem Relation Age of Onset   Heart disease Mother    Heart attack Mother    Heart disease Father    Aortic aneurysm Father    Dementia Father    Colon cancer Neg Hx    Stomach cancer Neg Hx    Esophageal cancer Neg Hx    Pancreatic cancer Neg Hx    Rectal cancer Neg Hx    Colon polyps Neg Hx       Current Outpatient Medications (Respiratory):    fluticasone (FLONASE) 50 MCG/ACT nasal spray, Place 2 sprays into both nostrils daily. (Patient taking differently: Place 2 sprays into both nostrils daily as needed.)    Current Outpatient Medications (Other):    Ascorbic Acid (VITAMIN C  GUMMIES PO), Take 1 tablet by mouth daily.   Biotin 1000 MCG tablet, Take 1,000 mcg by mouth daily.   Calcium Carb-Cholecalciferol (CALCIUM 500+D3 PO), Take by mouth.   Cholecalciferol (VITAMIN D PO), Take by mouth.   COVID-19 mRNA Vac-TriS, Pfizer, SUSP injection, Inject into the muscle.   glucosamine-chondroitin 500-400 MG tablet, Take 1 tablet by mouth daily.   Multiple Vitamin (MULTIVITAMIN) tablet, Take 1 tablet by mouth daily.   Reviewed prior external information including notes and imaging from  primary care provider As well as notes that were available from care everywhere and other healthcare systems.  Past medical history, social, surgical and family history all reviewed in electronic medical record.  No pertanent information unless stated regarding to the chief complaint.   Review of Systems:  No headache, visual changes, nausea, vomiting, diarrhea, constipation, dizziness, abdominal pain, skin rash, fevers, chills, night sweats, weight loss, swollen lymph nodes, body aches, joint swelling, chest pain, shortness of breath, mood changes. POSITIVE muscle aches  Objective  Blood pressure 124/82, pulse 80, height 5\' 7"  (1.702 m), weight 153 lb (69.4 kg), SpO2 98 %.   General: No apparent distress alert and oriented x3 mood and affect normal, dressed appropriately.  HEENT: Pupils equal, extraocular movements intact  Respiratory: Patient's speak in full sentences and does not appear short of breath  Cardiovascular: No lower extremity edema, non tender, no erythema  Gait normal with good balance and coordination.  MSK: Patient right shoulder still has some decrease in certain range of motion.  Positive impingement noted.  Rotator cuff has 4 out of 5 strength   Limited muscular skeletal ultrasound was performed and interpreted by Hulan Saas, M  Limited ultrasound of patient's right shoulder shows the patient still has some mild hypoechoic changes that is consistent with a  subacromial bursitis.  Patient does have a hypoechoic change noted of the supraspinatus that can be consistent with a partial-thickness tear.  Minimal retraction noted.  Patient does have mild hypoechoic changes of the acromioclavicular joint but less than previous exam. Impression: Improvement in subacromial bursitis and the acromioclavicular effusion but continued tear of the rotator cuff   Impression and Recommendations:    The above documentation has been reviewed and is accurate and complete Lyndal Pulley, DO

## 2022-02-07 ENCOUNTER — Ambulatory Visit: Payer: Medicare PPO | Admitting: Family Medicine

## 2022-02-07 ENCOUNTER — Ambulatory Visit: Payer: Self-pay

## 2022-02-07 ENCOUNTER — Other Ambulatory Visit: Payer: Self-pay

## 2022-02-07 ENCOUNTER — Encounter: Payer: Self-pay | Admitting: Family Medicine

## 2022-02-07 VITALS — BP 124/82 | HR 80 | Ht 67.0 in | Wt 153.0 lb

## 2022-02-07 DIAGNOSIS — M75111 Incomplete rotator cuff tear or rupture of right shoulder, not specified as traumatic: Secondary | ICD-10-CM | POA: Diagnosis not present

## 2022-02-07 DIAGNOSIS — M7551 Bursitis of right shoulder: Secondary | ICD-10-CM | POA: Diagnosis not present

## 2022-02-07 NOTE — Assessment & Plan Note (Signed)
Continues to have discomfort and pain, rotator cuff on ultrasound does not seem to be improving and I am concerned that there could be a deeper tear that is not appreciated on the ultrasound.  Patient has failed conservative therapy and is having weakness.  I do feel MRI with beneficial to help [place] with further treatment options including the possibility of PRP or surgical intervention.

## 2022-02-07 NOTE — Patient Instructions (Signed)
[place] [phone] Call Today  When we receive your results we will contact you. Read about PRP We'll talk about the next steps

## 2022-02-19 ENCOUNTER — Ambulatory Visit: Payer: Medicare PPO | Admitting: Family Medicine

## 2022-02-27 ENCOUNTER — Other Ambulatory Visit: Payer: Medicare PPO

## 2022-02-27 ENCOUNTER — Other Ambulatory Visit: Payer: Self-pay

## 2022-02-27 ENCOUNTER — Ambulatory Visit
Admission: RE | Admit: 2022-02-27 | Discharge: 2022-02-27 | Disposition: A | Discharge status: Home / Self Care | Payer: Medicare PPO | Source: Ambulatory Visit | Attending: Family Medicine | Admitting: Family Medicine

## 2022-02-27 DIAGNOSIS — S46011A Strain of muscle(s) and tendon(s) of the rotator cuff of right shoulder, initial encounter: Secondary | ICD-10-CM | POA: Diagnosis not present

## 2022-02-27 DIAGNOSIS — M25511 Pain in right shoulder: Secondary | ICD-10-CM | POA: Diagnosis not present

## 2022-02-27 MED ORDER — IOPAMIDOL (ISOVUE-M 300) INJECTION 61%
15.0000 mL | Freq: Once | INTRAMUSCULAR | Status: AC
  Administered 2022-02-27: 15 mL via INTRA_ARTICULAR

## 2022-03-06 ENCOUNTER — Other Ambulatory Visit: Payer: Self-pay

## 2022-03-06 DIAGNOSIS — Z96651 Presence of right artificial knee joint: Secondary | ICD-10-CM | POA: Diagnosis not present

## 2022-03-06 DIAGNOSIS — M75111 Incomplete rotator cuff tear or rupture of right shoulder, not specified as traumatic: Secondary | ICD-10-CM

## 2022-03-06 DIAGNOSIS — Z471 Aftercare following joint replacement surgery: Secondary | ICD-10-CM | POA: Diagnosis not present

## 2022-03-06 DIAGNOSIS — M25561 Pain in right knee: Secondary | ICD-10-CM | POA: Diagnosis not present

## 2022-03-14 DIAGNOSIS — M75111 Incomplete rotator cuff tear or rupture of right shoulder, not specified as traumatic: Secondary | ICD-10-CM | POA: Diagnosis not present

## 2022-03-14 DIAGNOSIS — M19011 Primary osteoarthritis, right shoulder: Secondary | ICD-10-CM | POA: Diagnosis not present

## 2022-03-17 ENCOUNTER — Telehealth: Payer: Self-pay | Admitting: Internal Medicine

## 2022-03-17 NOTE — Telephone Encounter (Signed)
Pt called to inform MD that she has joint replacement surgery for R shoulder scheduled for 4.20.23.  ? ? ?

## 2022-03-18 ENCOUNTER — Other Ambulatory Visit: Payer: Self-pay | Admitting: Orthopedic Surgery

## 2022-03-18 NOTE — Telephone Encounter (Signed)
Noted. ? ?Last seen 02/18/2021.  If preop clearance is needed she will need appointment. ?

## 2022-03-19 ENCOUNTER — Other Ambulatory Visit: Payer: Self-pay | Admitting: Orthopedic Surgery

## 2022-03-19 DIAGNOSIS — M25511 Pain in right shoulder: Secondary | ICD-10-CM

## 2022-03-25 ENCOUNTER — Other Ambulatory Visit: Payer: Self-pay | Admitting: Orthopedic Surgery

## 2022-03-28 ENCOUNTER — Other Ambulatory Visit: Payer: Self-pay

## 2022-03-28 ENCOUNTER — Encounter (HOSPITAL_COMMUNITY): Payer: Self-pay | Admitting: Orthopedic Surgery

## 2022-03-28 NOTE — Progress Notes (Signed)
COVID Vaccine Completed:  Yes x2 ?Date COVID Vaccine completed: ?Has received booster:   ?COVID vaccine manufacturer: [place]  ? ?Date of COVID positive in last 90 days:   ? ?PCP - Dr. Billey Gosling  ?Cardiologist - N/A ? ?Chest x-ray - 03-31-22 Epic ?EKG - 03-31-22 Epic ?Stress Test - N/A ?ECHO - N/A ?Cardiac Cath - N/A ?Pacemaker/ICD device last checked: ?Spinal Cord Stimulator: ? ?Bowel Prep - N/A ? ?Sleep Study - N/A ?CPAP -  ? ?Fasting Blood Sugar - N/A ?Checks Blood Sugar _____ times a day ? ?Blood Thinner Instructions:  N/A ?Aspirin Instructions: ?Last Dose: ? ?Activity level:   Can go up a flight of stairs and perform activities of daily living without stopping and without symptoms of chest pain or shortness of breath.  Able to exercise without symptoms ? ?Anesthesia review: N/A ? ?Patient denies shortness of breath, fever, cough and chest pain at PAT appointment (completed over the phone) ? ?Patient verbalized understanding of instructions that were given to them at the PAT appointment. Patient was also instructed that they will need to review over the PAT instructions again at home before surgery.  ?

## 2022-03-28 NOTE — Patient Instructions (Addendum)
DUE TO COVID-19 ONLY TWO VISITORS  (aged 70 and older)  IS ALLOWED TO COME WITH YOU AND STAY IN THE WAITING ROOM ONLY DURING PRE OP AND PROCEDURE.   ?**NO VISITORS ARE ALLOWED IN THE SHORT STAY AREA OR RECOVERY ROOM!!** ? ?You are not required to quarantine,  ?Hand Hygiene often ?Do NOT share personal items ?Notify your provider if you are in close contact with someone who has COVID or you develop fever 100.4 or greater, new onset of sneezing, cough, sore throat, shortness of breath or body aches. ?     ? Your procedure is scheduled on:  04-03-22 ? ? Report to [facility] Main Entrance ? ?  Report to admitting at 7:00 AM ? ? Call this number if you have problems the morning of surgery [phone] ? ? Do not eat food :After Midnight. ? ? After Midnight you may have the following liquids until 6:45 AM DAY OF SURGERY ? ?Water ?Black Coffee (sugar ok, NO MILK/CREAM OR CREAMERS)  ?Tea (sugar ok, NO MILK/CREAM OR CREAMERS) regular and decaf                             ?Plain Jell-O (NO RED)                                           ?Fruit ices (not with fruit pulp, NO RED)                                     ?Popsicles (NO RED)                                                                  ?Juice: apple, WHITE grape, WHITE cranberry ?Sports drinks like Gatorade (NO RED) ?Clear broth(vegetable,chicken,beef) ? ?             ?Drink 1 Ensure drink AT 6:45 AM the morning of surgery. ? ?  ?  ?The day of surgery:  ?Drink ONE (1) Pre-Surgery Clear Ensure at 6:45 AM the morning of surgery. Drink in one sitting. Do not sip.  ?This drink was given to you during your hospital  ?pre-op appointment visit. ?Nothing else to drink after completing the Pre-Surgery Clear Ensure ?  ?       If you have questions, please contact your surgeon?s office. ? ? ?FOLLOW ANY ADDITIONAL PRE OP INSTRUCTIONS YOU RECEIVED FROM YOUR SURGEON'S OFFICE!!! ?  ?  ?Oral Hygiene is also important to reduce your risk of infection.                                     ?Remember - BRUSH YOUR TEETH THE MORNING OF SURGERY WITH YOUR REGULAR TOOTHPASTE ? ? Do NOT smoke after Midnight ? ?Take these medicines the morning of surgery with A SIP OF WATER:  Claritin and okay to use Flonase nasal spray ?                  ?  You may not have any metal on your body including hair pins, jewelry, and body piercing ? ?           Do not wear make-up, lotions, powders, perfumes, or deodorant ? ?Do not wear nail polish including gel and S&S, artificial/acrylic nails, or any other type of covering on natural nails including finger and toenails. If you have artificial nails, gel coating, etc. that needs to be removed by a nail salon please have this removed prior to surgery or surgery may need to be canceled/ delayed if the surgeon/ anesthesia feels like they are unable to be safely monitored.  ? ?Do not shave  48 hours prior to surgery.  ? ? Do not bring valuables to the hospital. [facility]. ? ? Contacts, dentures or bridgework may not be worn into surgery. ?  ?Patients discharged on the day of surgery will not be allowed to drive home.  Someone NEEDS to stay with you for the first 24 hours after anesthesia. ? ?Please read over the following fact sheets you were given: IF YOU HAVE QUESTIONS ABOUT YOUR PRE-OP INSTRUCTIONS PLEASE CALL [facility] ? ?[facility]- Preparing for Total Shoulder Arthroplasty  ?  ?Before surgery, you can play an important role. Because skin is not sterile, your skin needs to be as free of germs as possible. You can reduce the number of germs on your skin by using the following products. ?Benzoyl Peroxide Gel ?Reduces the number of germs present on the skin ?Applied twice a day to shoulder area starting two days before surgery   ? ?================================================================== ? ?Please follow these instructions carefully: ? ?BENZOYL PEROXIDE 5% GEL ? ?Please do not use if you have an allergy  to benzoyl peroxide.   If your skin becomes reddened/irritated stop using the benzoyl peroxide. ? ?Starting two days before surgery, apply as follows: ?Apply benzoyl peroxide in the morning and at night. Apply after taking a shower. If you are not taking a shower clean entire shoulder front, back, and side along with the armpit with a clean wet washcloth. ? ?Place a quarter-sized dollop on your shoulder and rub in thoroughly, making sure to cover the front, back, and side of your shoulder, along with the armpit.  ? ?2 days before ____ AM   ____ PM              1 day before ____ AM   ____ PM ?                        ?Do this twice a day for two days.  (Last application is the night before surgery, AFTER using the CHG soap as described below). ? ?Do NOT apply benzoyl peroxide gel on the day of surgery.  ? ?[facility] - Preparing for Surgery ?Before surgery, you can play an important role.  Because skin is not sterile, your skin needs to be as free of germs as possible.  You can reduce the number of germs on your skin by washing with CHG (chlorahexidine gluconate) soap before surgery.  CHG is an antiseptic cleaner which kills germs and bonds with the skin to continue killing germs even after washing. ?Please DO NOT use if you have an allergy to CHG or antibacterial soaps.  If your skin becomes reddened/irritated stop using the CHG and inform your nurse when you arrive at Short Stay. ?Do not shave (including legs and underarms) for  at least 48 hours prior to the first CHG shower.  You may shave your face/neck. ? ?Please follow these instructions carefully: ? 1.  Shower with CHG Soap the night before surgery and the  morning of surgery. ? 2.  If you choose to wash your hair, wash your hair first as usual with your normal  shampoo. ? 3.  After you shampoo, rinse your hair and body thoroughly to remove the shampoo.                            ? 4.  Use CHG as you would any other liquid soap.  You can apply chg directly to  the skin and wash.  Gently with a scrungie or clean washcloth. ? 5.  Apply the CHG Soap to your body ONLY FROM THE NECK DOWN.   Do   not use on face/ open      ?                     Wound or open sores. Avoid contact with eyes, ears mouth and   genitals (private parts).  ?                     Production manager,  Genitals (private parts) with your normal soap. ?            6.  Wash thoroughly, paying special attention to the area where your    surgery  will be performed. ? 7.  Thoroughly rinse your body with warm water from the neck down. ? 8.  DO NOT shower/wash with your normal soap after using and rinsing off the CHG Soap. ?               9.  Pat yourself dry with a clean towel. ?           10.  Wear clean pajamas. ?           11.  Place clean sheets on your bed the night of your first shower and do not  sleep with pets. ?Day of Surgery : ?Do not apply any lotions/deodorants the morning of surgery.  Please wear clean clothes to the hospital/surgery center. ? ?FAILURE TO FOLLOW THESE INSTRUCTIONS MAY RESULT IN THE CANCELLATION OF YOUR SURGERY ? ?PATIENT SIGNATURE_________________________________ ? ?NURSE SIGNATURE__________________________________ ? ?________________________________________________________________________  ?  ? ?Incentive Spirometer ? ?An incentive spirometer is a tool that can help keep your lungs clear and active. This tool measures how well you are filling your lungs with each breath. Taking long deep breaths may help reverse or decrease the chance of developing breathing (pulmonary) problems (especially infection) following: ?A long period of time when you are unable to move or be active. ?BEFORE THE PROCEDURE  ?If the spirometer includes an indicator to show your best effort, your nurse or respiratory therapist will set it to a desired goal. ?If possible, sit up straight or lean slightly forward. Try not to slouch. ?Hold the incentive spirometer in an upright position. ?INSTRUCTIONS FOR USE  ?Sit on  the edge of your bed if possible, or sit up as far as you can in bed or on a chair. ?Hold the incentive spirometer in an upright position. ?Breathe out normally. ?Place the mouthpiece in your mouth a

## 2022-03-31 ENCOUNTER — Ambulatory Visit (HOSPITAL_COMMUNITY)
Admission: RE | Admit: 2022-03-31 | Discharge: 2022-03-31 | Disposition: A | Discharge status: Home / Self Care | Payer: Medicare PPO | Source: Ambulatory Visit | Attending: Orthopedic Surgery | Admitting: Orthopedic Surgery

## 2022-03-31 ENCOUNTER — Encounter (HOSPITAL_COMMUNITY)
Admission: RE | Admit: 2022-03-31 | Discharge: 2022-03-31 | Disposition: A | Discharge status: Home / Self Care | Payer: Medicare PPO | Source: Ambulatory Visit | Attending: Orthopedic Surgery | Admitting: Orthopedic Surgery

## 2022-03-31 ENCOUNTER — Ambulatory Visit
Admission: RE | Admit: 2022-03-31 | Discharge: 2022-03-31 | Disposition: A | Discharge status: Home / Self Care | Payer: Medicare PPO | Source: Ambulatory Visit | Attending: Orthopedic Surgery | Admitting: Orthopedic Surgery

## 2022-03-31 ENCOUNTER — Encounter: Payer: Self-pay | Admitting: Internal Medicine

## 2022-03-31 DIAGNOSIS — Z01818 Encounter for other preprocedural examination: Secondary | ICD-10-CM | POA: Diagnosis not present

## 2022-03-31 DIAGNOSIS — M25511 Pain in right shoulder: Secondary | ICD-10-CM

## 2022-03-31 DIAGNOSIS — Z0000 Encounter for general adult medical examination without abnormal findings: Secondary | ICD-10-CM | POA: Insufficient documentation

## 2022-03-31 LAB — CBC
HCT: 47.2 % — ABNORMAL HIGH (ref 36.0–46.0)
Hemoglobin: 15.7 g/dL — ABNORMAL HIGH (ref 12.0–15.0)
MCH: 31 pg (ref 26.0–34.0)
MCHC: 33.3 g/dL (ref 30.0–36.0)
MCV: 93.3 fL (ref 80.0–100.0)
Platelets: 222 10*3/uL (ref 150–400)
RBC: 5.06 MIL/uL (ref 3.87–5.11)
RDW: 12.6 % (ref 11.5–15.5)
WBC: 7 10*3/uL (ref 4.0–10.5)
nRBC: 0 % (ref 0.0–0.2)

## 2022-03-31 LAB — SURGICAL PCR SCREEN
MRSA, PCR: NEGATIVE
Staphylococcus aureus: POSITIVE — AB

## 2022-03-31 NOTE — Progress Notes (Signed)
? ? ?Subjective:  ? ? Patient ID: Diane [name], female    DOB: Dec 08, 1951, 71 y.o.   MRN: [id number] ? ? ?This visit occurred during the SARS-CoV-2 public health emergency.  Safety protocols were in place, including screening questions prior to the visit, additional usage of staff PPE, and extensive cleaning of exam room while observing appropriate contact time as indicated for disinfecting solutions. ? ? ? ?HPI ?Ellicia is here for  ?Chief Complaint  ?Patient presents with  ? Annual Exam  ? Pre-op Exam  ? Surgical clearance  ?  Had a recent pre-op and got labs done there  ? ? ?Scheduled for reverse shoulder arthroplasty 04/03/2022.  Dr. Tamera Punt will be doing the surgery and she had preop testing yesterday.  She has no difficulty with anesthesia.  She denies any personal or family history of blood clots. ? ? ?She had an EKG done yesterday that had new Q waves in V2-V3.  She states she had the EKG sitting up with her close when.  She has no cardiac history and denies any symptoms consistent with angina. ? ? ? ? ?Medications and allergies reviewed with patient and updated if appropriate. ? ? ?Current Outpatient Medications on File Prior to Visit  ?Medication Sig Dispense Refill  ? Biotin 1000 MCG tablet Take 1,000 mcg by mouth daily.    ? Calcium Carb-Cholecalciferol (CALCIUM 500+D3 PO) Take by mouth.    ? Cholecalciferol (VITAMIN D PO) Take by mouth.    ? fluticasone (FLONASE) 50 MCG/ACT nasal spray Place 2 sprays into both nostrils daily. (Patient taking differently: Place 2 sprays into both nostrils daily as needed.) 16 g 6  ? Multiple Vitamin (MULTIVITAMIN) tablet Take 1 tablet by mouth daily.    ? ?No current facility-administered medications on file prior to visit.  ? ? ?Review of Systems  ?Constitutional:  Negative for fever.  ?Eyes:  Negative for visual disturbance.  ?Respiratory:  Negative for cough, shortness of breath and wheezing.   ?Cardiovascular:  Negative for chest pain, palpitations and leg swelling.   ?Gastrointestinal:  Negative for abdominal pain, blood in stool, constipation, diarrhea and nausea.  ?     Rare gerd  ?Genitourinary:  Negative for dysuria.  ?Musculoskeletal:  Positive for arthralgias (right shoulder). Negative for back pain.  ?Skin:  Negative for rash.  ?Neurological:  Negative for dizziness, light-headedness, numbness and headaches.  ?Psychiatric/Behavioral:  Negative for dysphoric mood. The patient is not nervous/anxious.   ? ?   ?Objective:  ? ?Vitals:  ? 04/01/22 0837  ?BP: 128/76  ?Pulse: 83  ?Temp: 97.9 ?F (36.6 ?C)  ?SpO2: 97%  ? ?Filed Weights  ? 04/01/22 0837  ?Weight: 155 lb (70.3 kg)  ? ?Body mass index is 24.28 kg/m?. ? ?BP Readings from Last 3 Encounters:  ?04/01/22 128/76  ?03/31/22 (!) 147/83  ?02/07/22 124/82  ? ? ?Wt Readings from Last 3 Encounters:  ?04/01/22 155 lb (70.3 kg)  ?03/31/22 155 lb 8 oz (70.5 kg)  ?02/07/22 153 lb (69.4 kg)  ? ? ? ?  08/09/2021  ?  2:35 PM 02/18/2021  ?  9:32 AM 01/24/2019  ? 11:21 AM  ?Depression screen PHQ 2/9  ?Decreased Interest 0 0 0  ?Down, Depressed, Hopeless 0 0 0  ?PHQ - 2 Score 0 0 0  ? ? ? ?   ? View : No data to display.  ?  ?  ?  ? ? ? ? ?  ?Physical Exam ?Constitutional: She appears well-developed  and well-nourished. No distress.  ?HENT:  ?Head: Normocephalic and atraumatic.  ?Right Ear: External ear normal. Normal ear canal and TM ?Left Ear: External ear normal.  Normal ear canal and TM ?Mouth/Throat: Oropharynx is clear and moist.  ?Eyes: Conjunctivae and EOM are normal.  ?Neck: Neck supple. No tracheal deviation present. No thyromegaly present.  ?No carotid bruit  ?Cardiovascular: Normal rate, regular rhythm and normal heart sounds.   ?No murmur heard.  No edema. ?Pulmonary/Chest: Effort normal and breath sounds normal. No respiratory distress. She has no wheezes. She has no rales.  ?Breast: deferred   ?Abdominal: Soft. She exhibits no distension. There is no tenderness.  ?Lymphadenopathy: She has no cervical adenopathy.  ?Skin: Skin is  warm and dry. She is not diaphoretic.  ?Psychiatric: She has a normal mood and affect. Her behavior is normal.  ? ? ? ?Lab Results  ?Component Value Date  ? WBC 7.0 03/31/2022  ? HGB 15.7 (H) 03/31/2022  ? HCT 47.2 (H) 03/31/2022  ? PLT 222 03/31/2022  ? GLUCOSE 80 04/01/2022  ? CHOL 234 (H) 04/01/2022  ? TRIG 170.0 (H) 04/01/2022  ? HDL 58.60 04/01/2022  ? LDLCALC 141 (H) 04/01/2022  ? ALT 16 04/01/2022  ? AST 22 04/01/2022  ? NA 140 04/01/2022  ? K 4.3 04/01/2022  ? CL 104 04/01/2022  ? CREATININE 0.71 04/01/2022  ? BUN 17 04/01/2022  ? CO2 28 04/01/2022  ? TSH 1.34 04/01/2022  ? INR 1.0 02/18/2021  ? HGBA1C 5.4 02/18/2021  ? ? ?The 10-year ASCVD risk score (Arnett DK, et al., 2019) is: 8.9% ?  Values used to calculate the score: ?    Age: 75 years ?    Sex: Female ?    Is Non-Hispanic African American: No ?    Diabetic: No ?    Tobacco smoker: No ?    Systolic Blood Pressure: 607 mmHg ?    Is BP treated: No ?    HDL Cholesterol: 58.6 mg/dL ?    Total Cholesterol: 234 mg/dL ? ? ? ?   ?Assessment & Plan:  ? ?Physical exam: ?Screening blood work  ordered ?Exercise very active ?Weight normal ?Substance abuse  none ? ? ?Reviewed recommended immunizations. ? ? ?Health Maintenance  ?Topic Date Due  ? MAMMOGRAM  12/29/2019  ? COVID-19 Vaccine (5 - Booster for [place] series) 04/17/2022 (Originally 08/01/2021)  ? Zoster Vaccines- Shingrix (1 of 2) 07/01/2022 (Originally 09/28/2002)  ? Pneumonia Vaccine 64+ Years old (2 - PPSV23 if available, else PCV20) 04/02/2023 (Originally 09/07/2021)  ? DEXA SCAN  04/02/2023 (Originally 02/04/2021)  ? INFLUENZA VACCINE  07/15/2022  ? COLONOSCOPY (Pts 45-61yr Insurance coverage will need to be confirmed)  04/17/2027  ? TETANUS/TDAP  07/15/2028  ? HPV VACCINES  Aged Out  ? Hepatitis C Screening  Discontinued  ?  ? ? ? ? ? ? ?See Problem List for Assessment and Plan of chronic medical problems. ? ? ? ? ?

## 2022-03-31 NOTE — Patient Instructions (Addendum)
?An EKG was done today. ? ? ?Blood work was ordered.   ? ? ?Medications changes include :   none ? ? ? ?Return in about 1 year (around 04/02/2023) for Physical Exam. ? ? ? ?Health Maintenance, Female ?Adopting a healthy lifestyle and getting preventive care are important in promoting health and wellness. Ask your health care provider about: ?The right schedule for you to have regular tests and exams. ?Things you can do on your own to prevent diseases and keep yourself healthy. ?What should I know about diet, weight, and exercise? ?Eat a healthy diet ? ?Eat a diet that includes plenty of vegetables, fruits, low-fat dairy products, and lean protein. ?Do not eat a lot of foods that are high in solid fats, added sugars, or sodium. ?Maintain a healthy weight ?Body mass index (BMI) is used to identify weight problems. It estimates body fat based on height and weight. Your health care provider can help determine your BMI and help you achieve or maintain a healthy weight. ?Get regular exercise ?Get regular exercise. This is one of the most important things you can do for your health. Most adults should: ?Exercise for at least 150 minutes each week. The exercise should increase your heart rate and make you sweat (moderate-intensity exercise). ?Do strengthening exercises at least twice a week. This is in addition to the moderate-intensity exercise. ?Spend less time sitting. Even light physical activity can be beneficial. ?Watch cholesterol and blood lipids ?Have your blood tested for lipids and cholesterol at 70 years of age, then have this test every 5 years. ?Have your cholesterol levels checked more often if: ?Your lipid or cholesterol levels are high. ?You are older than 70 years of age. ?You are at high risk for heart disease. ?What should I know about cancer screening? ?Depending on your health history and family history, you may need to have cancer screening at various ages. This may include screening for: ?Breast  cancer. ?Cervical cancer. ?Colorectal cancer. ?Skin cancer. ?Lung cancer. ?What should I know about heart disease, diabetes, and high blood pressure? ?Blood pressure and heart disease ?High blood pressure causes heart disease and increases the risk of stroke. This is more likely to develop in people who have high blood pressure readings or are overweight. ?Have your blood pressure checked: ?Every 3-5 years if you are 82-66 years of age. ?Every year if you are 35 years old or older. ?Diabetes ?Have regular diabetes screenings. This checks your fasting blood sugar level. Have the screening done: ?Once every three years after age 43 if you are at a normal weight and have a low risk for diabetes. ?More often and at a younger age if you are overweight or have a high risk for diabetes. ?What should I know about preventing infection? ?Hepatitis B ?If you have a higher risk for hepatitis B, you should be screened for this virus. Talk with your health care provider to find out if you are at risk for hepatitis B infection. ?Hepatitis C ?Testing is recommended for: ?Everyone born from [birth year] through 1964. ?Anyone with known risk factors for hepatitis C. ?Sexually transmitted infections (STIs) ?Get screened for STIs, including gonorrhea and chlamydia, if: ?You are sexually active and are younger than 70 years of age. ?You are older than 70 years of age and your health care provider tells you that you are at risk for this type of infection. ?Your sexual activity has changed since you were last screened, and you are at increased risk for chlamydia or  gonorrhea. Ask your health care provider if you are at risk. ?Ask your health care provider about whether you are at high risk for HIV. Your health care provider may recommend a prescription medicine to help prevent HIV infection. If you choose to take medicine to prevent HIV, you should first get tested for HIV. You should then be tested every 3 months for as long as you are taking  the medicine. ?Pregnancy ?If you are about to stop having your period (premenopausal) and you may become pregnant, seek counseling before you get pregnant. ?Take 400 to 800 micrograms (mcg) of folic acid every day if you become pregnant. ?Ask for birth control (contraception) if you want to prevent pregnancy. ?Osteoporosis and menopause ?Osteoporosis is a disease in which the bones lose minerals and strength with aging. This can result in bone fractures. If you are 54 years old or older, or if you are at risk for osteoporosis and fractures, ask your health care provider if you should: ?Be screened for bone loss. ?Take a calcium or vitamin D supplement to lower your risk of fractures. ?Be given hormone replacement therapy (HRT) to treat symptoms of menopause. ?Follow these instructions at home: ?Alcohol use ?Do not drink alcohol if: ?Your health care provider tells you not to drink. ?You are pregnant, may be pregnant, or are planning to become pregnant. ?If you drink alcohol: ?Limit how much you have to: ?0-1 drink a day. ?Know how much alcohol is in your drink. In the [place] one drink equals one 12 oz bottle of beer (355 mL), one 5 oz glass of wine (148 mL), or one 1? oz glass of hard liquor (44 mL). ?Lifestyle ?Do not use any products that contain nicotine or tobacco. These products include cigarettes, chewing tobacco, and vaping devices, such as e-cigarettes. If you need help quitting, ask your health care provider. ?Do not use street drugs. ?Do not share needles. ?Ask your health care provider for help if you need support or information about quitting drugs. ?General instructions ?Schedule regular health, dental, and eye exams. ?Stay current with your vaccines. ?Tell your health care provider if: ?You often feel depressed. ?You have ever been abused or do not feel safe at home. ?Summary ?Adopting a healthy lifestyle and getting preventive care are important in promoting health and wellness. ?Follow your health  care provider's instructions about healthy diet, exercising, and getting tested or screened for diseases. ?Follow your health care provider's instructions on monitoring your cholesterol and blood pressure. ?This information is not intended to replace advice given to you by your health care provider. Make sure you discuss any questions you have with your health care provider. ?Document Revised: 04/22/2021 Document Reviewed: 04/22/2021 ?Elsevier Patient Education ? [place]. ? ?

## 2022-03-31 NOTE — Progress Notes (Signed)
PCR results sent to Dr. Chandler to review. 

## 2022-04-01 ENCOUNTER — Ambulatory Visit: Payer: Medicare PPO | Admitting: Internal Medicine

## 2022-04-01 ENCOUNTER — Encounter: Payer: Self-pay | Admitting: Internal Medicine

## 2022-04-01 VITALS — BP 128/76 | HR 83 | Temp 97.9°F | Ht 67.0 in | Wt 155.0 lb

## 2022-04-01 DIAGNOSIS — M85852 Other specified disorders of bone density and structure, left thigh: Secondary | ICD-10-CM | POA: Diagnosis not present

## 2022-04-01 DIAGNOSIS — Z01818 Encounter for other preprocedural examination: Secondary | ICD-10-CM

## 2022-04-01 DIAGNOSIS — R9431 Abnormal electrocardiogram [ECG] [EKG]: Secondary | ICD-10-CM

## 2022-04-01 DIAGNOSIS — E7849 Other hyperlipidemia: Secondary | ICD-10-CM | POA: Diagnosis not present

## 2022-04-01 DIAGNOSIS — Z0000 Encounter for general adult medical examination without abnormal findings: Secondary | ICD-10-CM | POA: Diagnosis not present

## 2022-04-01 LAB — COMPREHENSIVE METABOLIC PANEL
ALT: 16 U/L (ref 0–35)
AST: 22 U/L (ref 0–37)
Albumin: 4.5 g/dL (ref 3.5–5.2)
Alkaline Phosphatase: 85 U/L (ref 39–117)
BUN: 17 mg/dL (ref 6–23)
CO2: 28 mEq/L (ref 19–32)
Calcium: 9.9 mg/dL (ref 8.4–10.5)
Chloride: 104 mEq/L (ref 96–112)
Creatinine, Ser: 0.71 mg/dL (ref 0.40–1.20)
GFR: 86.7 mL/min (ref >60.00)
Glucose, Bld: 80 mg/dL (ref 70–99)
Potassium: 4.3 mEq/L (ref 3.5–5.1)
Sodium: 140 mEq/L (ref 135–145)
Total Bilirubin: 0.4 mg/dL (ref 0.2–1.2)
Total Protein: 7.3 g/dL (ref 6.0–8.3)

## 2022-04-01 LAB — LIPID PANEL
Cholesterol: 234 mg/dL — ABNORMAL HIGH (ref 0–200)
HDL: 58.6 mg/dL (ref >39.00)
LDL Cholesterol: 141 mg/dL — ABNORMAL HIGH (ref 0–99)
NonHDL: 175.21
Total CHOL/HDL Ratio: 4
Triglycerides: 170 mg/dL — ABNORMAL HIGH (ref 0.0–149.0)
VLDL: 34 mg/dL (ref 0.0–40.0)

## 2022-04-01 LAB — VITAMIN D 25 HYDROXY (VIT D DEFICIENCY, FRACTURES): VITD: 46.74 ng/mL (ref 30.00–100.00)

## 2022-04-01 LAB — TSH: TSH: 1.34 u[IU]/mL (ref 0.35–5.50)

## 2022-04-01 NOTE — Assessment & Plan Note (Signed)
Chronic ?Regular exercise and healthy diet encouraged ?Check lipid panel, CMP, TSH ?Diet controlled ?

## 2022-04-01 NOTE — Assessment & Plan Note (Addendum)
Chronic ?DEXA due-deferred until next year since she is having surgery in a couple of days ?Continue calcium and vitamin D ?Stressed regular exercise ?Check vitamin D level ?

## 2022-04-01 NOTE — Assessment & Plan Note (Addendum)
Having reverse shoulder arthroplasty 4/20 by Dr. Tamera Punt ?Overall healthy and no concerning symptoms of cardiac or respiratory disease.  She does not have diabetes.  She is not currently on any prescription medication ?Had EKG done yesterday-new Q waves in V2-V3 ?Low risk for CAD-repeat EKG today since new Q waves could be lead placement ?EKG: NSR at 81 bpm, possible LAE, normal EKG.  Q wave in V3 no longer present compared to EKG from yesterday.  EKG is the same as her EKG in her chart from 2017 ?No further evaluation necessary. ? ?CMP today ?

## 2022-04-02 NOTE — Anesthesia Preprocedure Evaluation (Addendum)
Anesthesia Evaluation  ?Patient identified by MRN, date of birth, ID band ?Patient awake ? ? ? ?Reviewed: ?Allergy & Precautions, NPO status , Patient's Chart, lab work & pertinent test results ? ?History of Anesthesia Complications ?Negative for: history of anesthetic complications ? ?Airway ?Mallampati: II ? ?TM Distance: >3 FB ?Neck ROM: Full ? ? ? Dental ?no notable dental hx. ? ?  ?Pulmonary ?neg pulmonary ROS,  ?  ?Pulmonary exam normal ? ? ? ? ? ? ? Cardiovascular ?negative cardio ROS ?Normal cardiovascular exam ? ? ?  ?Neuro/Psych ?negative neurological ROS ?   ? GI/Hepatic ?Neg liver ROS, GERD  Controlled,  ?Endo/Other  ?negative endocrine ROS ? Renal/GU ?negative Renal ROS  ?negative genitourinary ?  ?Musculoskeletal ? ?(+) Arthritis ,  ? Abdominal ?  ?Peds ? Hematology ?negative hematology ROS ?(+)   ?Anesthesia Other Findings ?Day of surgery medications reviewed with patient. ? Reproductive/Obstetrics ?negative OB ROS ? ?  ? ? ? ? ? ? ? ? ? ? ? ? ? ?  ?  ? ? ? ? ? ? ? ?Anesthesia Physical ?Anesthesia Plan ? ?ASA: 2 ? ?Anesthesia Plan: General  ? ?Post-op Pain Management: Tylenol PO (pre-op)* and Regional block*  ? ?Induction: Intravenous ? ?PONV Risk Score and Plan: 3 and Treatment may vary due to age or medical condition, Ondansetron, Dexamethasone and Midazolam ? ?Airway Management Planned: Oral ETT ? ?Additional Equipment: None ? ?Intra-op Plan:  ? ?Post-operative Plan: Extubation in OR ? ?Informed Consent: I have reviewed the patients History and Physical, chart, labs and discussed the procedure including the risks, benefits and alternatives for the proposed anesthesia with the patient or authorized representative who has indicated his/her understanding and acceptance.  ? ? ? ?Dental advisory given ? ?Plan Discussed with: CRNA ? ?Anesthesia Plan Comments:   ? ? ? ? ? ?Anesthesia Quick Evaluation ? ?

## 2022-04-03 ENCOUNTER — Encounter (HOSPITAL_COMMUNITY)
Admission: RE | Disposition: A | Discharge status: Home / Self Care | Payer: Self-pay | Source: Home / Self Care | Attending: Orthopedic Surgery

## 2022-04-03 ENCOUNTER — Ambulatory Visit (HOSPITAL_COMMUNITY)
Admission: RE | Admit: 2022-04-03 | Discharge: 2022-04-03 | Disposition: A | Discharge status: Home / Self Care | Payer: Medicare PPO | Attending: Orthopedic Surgery | Admitting: Orthopedic Surgery

## 2022-04-03 ENCOUNTER — Other Ambulatory Visit: Payer: Self-pay

## 2022-04-03 ENCOUNTER — Ambulatory Visit (HOSPITAL_COMMUNITY): Payer: Medicare PPO

## 2022-04-03 ENCOUNTER — Encounter (HOSPITAL_COMMUNITY): Payer: Self-pay | Admitting: Orthopedic Surgery

## 2022-04-03 ENCOUNTER — Ambulatory Visit (HOSPITAL_COMMUNITY): Payer: Medicare PPO | Admitting: Anesthesiology

## 2022-04-03 ENCOUNTER — Ambulatory Visit (HOSPITAL_BASED_OUTPATIENT_CLINIC_OR_DEPARTMENT_OTHER): Payer: Medicare PPO | Admitting: Anesthesiology

## 2022-04-03 DIAGNOSIS — M19011 Primary osteoarthritis, right shoulder: Secondary | ICD-10-CM

## 2022-04-03 DIAGNOSIS — Z01818 Encounter for other preprocedural examination: Secondary | ICD-10-CM

## 2022-04-03 DIAGNOSIS — G8918 Other acute postprocedural pain: Secondary | ICD-10-CM | POA: Diagnosis not present

## 2022-04-03 DIAGNOSIS — K219 Gastro-esophageal reflux disease without esophagitis: Secondary | ICD-10-CM | POA: Diagnosis not present

## 2022-04-03 DIAGNOSIS — Z96611 Presence of right artificial shoulder joint: Secondary | ICD-10-CM | POA: Diagnosis not present

## 2022-04-03 DIAGNOSIS — M75101 Unspecified rotator cuff tear or rupture of right shoulder, not specified as traumatic: Secondary | ICD-10-CM | POA: Diagnosis not present

## 2022-04-03 HISTORY — PX: REVERSE SHOULDER ARTHROPLASTY: SHX5054

## 2022-04-03 HISTORY — DX: Unspecified osteoarthritis, unspecified site: M19.90

## 2022-04-03 LAB — TYPE AND SCREEN
ABO/RH(D): O POS
Antibody Screen: NEGATIVE

## 2022-04-03 LAB — ABO/RH: ABO/RH(D): O POS

## 2022-04-03 SURGERY — ARTHROPLASTY, SHOULDER, TOTAL, REVERSE
Anesthesia: General | Site: Shoulder | Laterality: Right

## 2022-04-03 MED ORDER — OXYCODONE HCL 5 MG/5ML PO SOLN: 5.0000 mg | Freq: Once | ORAL | Status: DC | PRN

## 2022-04-03 MED ORDER — SODIUM CHLORIDE 0.9 % IR SOLN
Status: DC | PRN
  Administered 2022-04-03: 1000 mL

## 2022-04-03 MED ORDER — MIDAZOLAM HCL 2 MG/2ML IJ SOLN
INTRAMUSCULAR | Status: AC
  Filled 2022-04-03: qty 2

## 2022-04-03 MED ORDER — TRANEXAMIC ACID-NACL 1000-0.7 MG/100ML-% IV SOLN
1000.0000 mg | INTRAVENOUS | Status: AC
  Administered 2022-04-03: 1000 mg via INTRAVENOUS
  Filled 2022-04-03: qty 100

## 2022-04-03 MED ORDER — ONDANSETRON HCL 4 MG/2ML IJ SOLN
INTRAMUSCULAR | Status: DC | PRN
  Administered 2022-04-03: 4 mg via INTRAVENOUS

## 2022-04-03 MED ORDER — ROCURONIUM BROMIDE 10 MG/ML (PF) SYRINGE
PREFILLED_SYRINGE | INTRAVENOUS | Status: DC | PRN
  Administered 2022-04-03: 50 mg via INTRAVENOUS

## 2022-04-03 MED ORDER — OXYCODONE HCL 5 MG PO TABS: 5.0000 mg | ORAL_TABLET | ORAL | Status: DC | PRN

## 2022-04-03 MED ORDER — ONDANSETRON HCL 4 MG PO TABS: 4.0000 mg | ORAL_TABLET | Freq: Four times a day (QID) | ORAL | Status: DC | PRN

## 2022-04-03 MED ORDER — CHLORHEXIDINE GLUCONATE 0.12 % MT SOLN
15.0000 mL | Freq: Once | OROMUCOSAL | Status: AC
  Administered 2022-04-03: 15 mL via OROMUCOSAL

## 2022-04-03 MED ORDER — METOCLOPRAMIDE HCL 5 MG/ML IJ SOLN: 5.0000 mg | Freq: Three times a day (TID) | INTRAMUSCULAR | Status: DC | PRN

## 2022-04-03 MED ORDER — FENTANYL CITRATE (PF) 100 MCG/2ML IJ SOLN
INTRAMUSCULAR | Status: AC
  Filled 2022-04-03: qty 2

## 2022-04-03 MED ORDER — OXYCODONE HCL 5 MG PO TABS: 5.0000 mg | ORAL_TABLET | Freq: Once | ORAL | Status: DC | PRN

## 2022-04-03 MED ORDER — PHENYLEPHRINE HCL-NACL 20-0.9 MG/250ML-% IV SOLN
INTRAVENOUS | Status: AC
  Filled 2022-04-03: qty 250

## 2022-04-03 MED ORDER — ACETAMINOPHEN 325 MG PO TABS: 325.0000 mg | ORAL_TABLET | Freq: Four times a day (QID) | ORAL | Status: DC | PRN

## 2022-04-03 MED ORDER — LIDOCAINE 2% (20 MG/ML) 5 ML SYRINGE
INTRAMUSCULAR | Status: DC | PRN
  Administered 2022-04-03: 80 mg via INTRAVENOUS

## 2022-04-03 MED ORDER — DEXAMETHASONE SODIUM PHOSPHATE 10 MG/ML IJ SOLN
INTRAMUSCULAR | Status: AC
  Filled 2022-04-03: qty 1

## 2022-04-03 MED ORDER — METHOCARBAMOL 500 MG PO TABS: 500.0000 mg | ORAL_TABLET | Freq: Four times a day (QID) | ORAL | Status: DC | PRN

## 2022-04-03 MED ORDER — BUPIVACAINE LIPOSOME 1.3 % IJ SUSP
INTRAMUSCULAR | Status: DC | PRN
  Administered 2022-04-03: 10 mL via PERINEURAL

## 2022-04-03 MED ORDER — PROPOFOL 10 MG/ML IV BOLUS
INTRAVENOUS | Status: DC | PRN
Start: 2022-04-03 — End: 2022-04-03
  Administered 2022-04-03: 140 mg via INTRAVENOUS

## 2022-04-03 MED ORDER — METOCLOPRAMIDE HCL 5 MG PO TABS: 5.0000 mg | ORAL_TABLET | Freq: Three times a day (TID) | ORAL | Status: DC | PRN

## 2022-04-03 MED ORDER — ONDANSETRON HCL 4 MG/2ML IJ SOLN
INTRAMUSCULAR | Status: AC
  Filled 2022-04-03: qty 2

## 2022-04-03 MED ORDER — CEFAZOLIN SODIUM-DEXTROSE 2-4 GM/100ML-% IV SOLN
2.0000 g | INTRAVENOUS | Status: AC
  Administered 2022-04-03: 2 g via INTRAVENOUS
  Filled 2022-04-03: qty 100

## 2022-04-03 MED ORDER — SUGAMMADEX SODIUM 200 MG/2ML IV SOLN
INTRAVENOUS | Status: DC | PRN
  Administered 2022-04-03: 150 mg via INTRAVENOUS

## 2022-04-03 MED ORDER — LACTATED RINGERS IV SOLN: INTRAVENOUS | Status: DC

## 2022-04-03 MED ORDER — LIDOCAINE HCL (PF) 2 % IJ SOLN
INTRAMUSCULAR | Status: AC
  Filled 2022-04-03: qty 5

## 2022-04-03 MED ORDER — MIDAZOLAM HCL 5 MG/5ML IJ SOLN
INTRAMUSCULAR | Status: DC | PRN
  Administered 2022-04-03 (×2): 1 mg via INTRAVENOUS

## 2022-04-03 MED ORDER — METHOCARBAMOL 500 MG IVPB - SIMPLE MED
500.0000 mg | Freq: Four times a day (QID) | INTRAVENOUS | Status: DC | PRN
Start: 2022-04-03 — End: 2022-04-03

## 2022-04-03 MED ORDER — WATER FOR IRRIGATION, STERILE IR SOLN
Status: DC | PRN
  Administered 2022-04-03: 2000 mL

## 2022-04-03 MED ORDER — ORAL CARE MOUTH RINSE: 15.0000 mL | Freq: Once | OROMUCOSAL | Status: AC

## 2022-04-03 MED ORDER — OXYCODONE-ACETAMINOPHEN 5-325 MG PO TABS: 1.0000 | ORAL_TABLET | Freq: Four times a day (QID) | ORAL | 0 refills | Status: DC | PRN

## 2022-04-03 MED ORDER — PROPOFOL 10 MG/ML IV BOLUS
INTRAVENOUS | Status: AC
  Filled 2022-04-03: qty 20

## 2022-04-03 MED ORDER — OXYCODONE HCL 5 MG PO TABS: 10.0000 mg | ORAL_TABLET | ORAL | Status: DC | PRN

## 2022-04-03 MED ORDER — FENTANYL CITRATE PF 50 MCG/ML IJ SOSY: 25.0000 ug | PREFILLED_SYRINGE | INTRAMUSCULAR | Status: DC | PRN

## 2022-04-03 MED ORDER — DEXAMETHASONE SODIUM PHOSPHATE 10 MG/ML IJ SOLN
INTRAMUSCULAR | Status: DC | PRN
  Administered 2022-04-03: 10 mg via INTRAVENOUS

## 2022-04-03 MED ORDER — BUPIVACAINE-EPINEPHRINE (PF) 0.5% -1:200000 IJ SOLN
INTRAMUSCULAR | Status: DC | PRN
  Administered 2022-04-03: 15 mL via PERINEURAL

## 2022-04-03 MED ORDER — 0.9 % SODIUM CHLORIDE (POUR BTL) OPTIME
TOPICAL | Status: DC | PRN
  Administered 2022-04-03: 1000 mL

## 2022-04-03 MED ORDER — FENTANYL CITRATE (PF) 250 MCG/5ML IJ SOLN
INTRAMUSCULAR | Status: DC | PRN
  Administered 2022-04-03 (×2): 50 ug via INTRAVENOUS

## 2022-04-03 MED ORDER — TIZANIDINE HCL 2 MG PO TABS
2.0000 mg | ORAL_TABLET | Freq: Three times a day (TID) | ORAL | 0 refills | Status: DC | PRN
Start: 2022-04-03 — End: 2022-09-29

## 2022-04-03 MED ORDER — ONDANSETRON HCL 4 MG/2ML IJ SOLN: 4.0000 mg | Freq: Four times a day (QID) | INTRAMUSCULAR | Status: DC | PRN

## 2022-04-03 MED ORDER — PHENYLEPHRINE 80 MCG/ML (10ML) SYRINGE FOR IV PUSH (FOR BLOOD PRESSURE SUPPORT)
PREFILLED_SYRINGE | INTRAVENOUS | Status: AC
  Filled 2022-04-03: qty 10

## 2022-04-03 MED ORDER — DROPERIDOL 2.5 MG/ML IJ SOLN: 0.6250 mg | Freq: Once | INTRAMUSCULAR | Status: DC | PRN

## 2022-04-03 MED ORDER — HYDROMORPHONE HCL 1 MG/ML IJ SOLN: 0.5000 mg | INTRAMUSCULAR | Status: DC | PRN

## 2022-04-03 MED ORDER — ACETAMINOPHEN 500 MG PO TABS
1000.0000 mg | ORAL_TABLET | Freq: Four times a day (QID) | ORAL | Status: DC
Start: 2022-04-03 — End: 2022-04-03

## 2022-04-03 MED ORDER — ROCURONIUM BROMIDE 10 MG/ML (PF) SYRINGE
PREFILLED_SYRINGE | INTRAVENOUS | Status: AC
  Filled 2022-04-03: qty 10

## 2022-04-03 MED ORDER — PHENYLEPHRINE 80 MCG/ML (10ML) SYRINGE FOR IV PUSH (FOR BLOOD PRESSURE SUPPORT)
PREFILLED_SYRINGE | INTRAVENOUS | Status: DC | PRN
  Administered 2022-04-03: 80 ug via INTRAVENOUS
  Administered 2022-04-03: 160 ug via INTRAVENOUS
  Administered 2022-04-03 (×3): 80 ug via INTRAVENOUS
  Administered 2022-04-03: 160 ug via INTRAVENOUS
  Administered 2022-04-03: 80 ug via INTRAVENOUS

## 2022-04-03 MED ORDER — ACETAMINOPHEN 500 MG PO TABS
1000.0000 mg | ORAL_TABLET | Freq: Once | ORAL | Status: AC
  Administered 2022-04-03: 1000 mg via ORAL
  Filled 2022-04-03: qty 2

## 2022-04-03 SURGICAL SUPPLY — 86 items
AID PSTN UNV HD RSTRNT DISP (MISCELLANEOUS)
BAG COUNTER SPONGE SURGICOUNT (BAG) ×1 IMPLANT
BAG SPEC THK2 15X12 ZIP CLS (MISCELLANEOUS) ×1
BAG SPNG CNTER NS LX DISP (BAG) ×1
BAG ZIPLOCK 12X15 (MISCELLANEOUS) ×2 IMPLANT
BASEPLATE P2 COATD GLND 6.5X30 (Shoulder) IMPLANT
BIT DRILL 1.6MX128 (BIT) IMPLANT
BIT DRILL 2.5 DIA 127 CALI (BIT) ×1 IMPLANT
BIT DRILL 4 DIA CALIBRATED (BIT) ×1 IMPLANT
BLADE SAW SAG 73X25 THK (BLADE) ×1
BLADE SAW SGTL 73X25 THK (BLADE) ×1 IMPLANT
BOOTIES KNEE HIGH SLOAN (MISCELLANEOUS) ×4 IMPLANT
BSPLAT GLND 30 STRL LF SHLDR (Shoulder) ×1 IMPLANT
COOLER ICEMAN CLASSIC (MISCELLANEOUS) ×1 IMPLANT
COVER BACK TABLE 60X90IN (DRAPES) ×2 IMPLANT
COVER SURGICAL LIGHT HANDLE (MISCELLANEOUS) ×2 IMPLANT
DRAPE INCISE IOBAN 66X45 STRL (DRAPES) ×2 IMPLANT
DRAPE ORTHO SPLIT 77X108 STRL (DRAPES) ×4
DRAPE POUCH INSTRU U-SHP 10X18 (DRAPES) ×2 IMPLANT
DRAPE SHEET LG 3/4 BI-LAMINATE (DRAPES) ×2 IMPLANT
DRAPE SURG 17X11 SM STRL (DRAPES) ×2 IMPLANT
DRAPE SURG ORHT 6 SPLT 77X108 (DRAPES) ×2 IMPLANT
DRAPE TOP 10253 STERILE (DRAPES) ×2 IMPLANT
DRAPE U-SHAPE 47X51 STRL (DRAPES) ×2 IMPLANT
DRSG AQUACEL AG ADV 3.5X 6 (GAUZE/BANDAGES/DRESSINGS) ×2 IMPLANT
DURAPREP 26ML APPLICATOR (WOUND CARE) ×4 IMPLANT
ELECT BLADE TIP CTD 4 INCH (ELECTRODE) ×2 IMPLANT
ELECT REM PT RETURN 15FT ADLT (MISCELLANEOUS) ×2 IMPLANT
GLOVE BIO SURGEON STRL SZ7 (GLOVE) ×2 IMPLANT
GLOVE BIO SURGEON STRL SZ7.5 (GLOVE) ×2 IMPLANT
GLOVE BIOGEL PI IND STRL 6.5 (GLOVE) ×1 IMPLANT
GLOVE BIOGEL PI IND STRL 7.0 (GLOVE) ×1 IMPLANT
GLOVE BIOGEL PI IND STRL 8 (GLOVE) ×1 IMPLANT
GLOVE BIOGEL PI INDICATOR 6.5 (GLOVE) ×1
GLOVE BIOGEL PI INDICATOR 7.0 (GLOVE) ×1
GLOVE BIOGEL PI INDICATOR 8 (GLOVE) ×1
GLOVE SURG POLYISO LF SZ6.5 (GLOVE) ×2 IMPLANT
GOWN STRL REUS W/ TWL XL LVL3 (GOWN DISPOSABLE) ×1 IMPLANT
GOWN STRL REUS W/TWL XL LVL3 (GOWN DISPOSABLE) ×2
HANDPIECE INTERPULSE COAX TIP (DISPOSABLE) ×2
HOOD PEEL AWAY FLYTE STAYCOOL (MISCELLANEOUS) ×6 IMPLANT
HUMERA STEM SM SHELL SHOU 10 (Miscellaneous) ×2 IMPLANT
INSERT SMALL SOCKET 32MM NEU (Insert) ×1 IMPLANT
KIT BASIN OR (CUSTOM PROCEDURE TRAY) ×2 IMPLANT
KIT TURNOVER KIT A (KITS) IMPLANT
MANIFOLD NEPTUNE II (INSTRUMENTS) ×2 IMPLANT
NDL TROCAR POINT SZ 2 1/2 (NEEDLE) IMPLANT
NEEDLE TROCAR POINT SZ 2 1/2 (NEEDLE) ×2 IMPLANT
NS IRRIG 1000ML POUR BTL (IV SOLUTION) ×2 IMPLANT
P2 COATDE GLNOID BSEPLT 6.5X30 (Shoulder) ×2 IMPLANT
PACK SHOULDER (CUSTOM PROCEDURE TRAY) ×2 IMPLANT
PAD COLD SHLDR WRAP-ON (PAD) ×1 IMPLANT
PROTECTOR NERVE ULNAR (MISCELLANEOUS) IMPLANT
RESTRAINT HEAD UNIVERSAL NS (MISCELLANEOUS) IMPLANT
RETRIEVER SUT HEWSON (MISCELLANEOUS) ×1 IMPLANT
SCREW BONE LOCKING RSP 5.0X14 (Screw) ×4 IMPLANT
SCREW BONE LOCKING RSP 5.0X30 (Screw) ×2 IMPLANT
SCREW BONE RSP LOCK 5X14 (Screw) IMPLANT
SCREW BONE RSP LOCK 5X26 (Screw) IMPLANT
SCREW BONE RSP LOCK 5X30 (Screw) IMPLANT
SCREW BONE RSP LOCKING 5.0X26 (Screw) ×2 IMPLANT
SCREW RETAIN W/HEAD 4MM OFFSET (Shoulder) ×1 IMPLANT
SET HNDPC FAN SPRY TIP SCT (DISPOSABLE) ×1 IMPLANT
SLEEVE SCD COMPRESS KNEE MED (STOCKING) ×1 IMPLANT
SLING ARM IMMOBILIZER LRG (SOFTGOODS) IMPLANT
SLING ARM IMMOBILIZER MED (SOFTGOODS) ×1 IMPLANT
SPONGE T-LAP 18X18 ~~LOC~~+RFID (SPONGE) ×2 IMPLANT
SPONGE T-LAP 4X18 ~~LOC~~+RFID (SPONGE) ×1 IMPLANT
STEM HUMERAL SM SHELL SHOU 10 (Miscellaneous) IMPLANT
STRIP CLOSURE SKIN 1/2X4 (GAUZE/BANDAGES/DRESSINGS) ×4 IMPLANT
SUCTION FRAZIER HANDLE 10FR (MISCELLANEOUS)
SUCTION TUBE FRAZIER 10FR DISP (MISCELLANEOUS) IMPLANT
SUPPORT WRAP ARM LG (MISCELLANEOUS) IMPLANT
SUT ETHIBOND 2 V 37 (SUTURE) ×1 IMPLANT
SUT FIBERWIRE #2 38 REV NDL BL (SUTURE)
SUT MNCRL AB 4-0 PS2 18 (SUTURE) ×2 IMPLANT
SUT SILK 3 0 SH CR/8 (SUTURE) IMPLANT
SUT VIC AB 2-0 CT1 27 (SUTURE) ×2
SUT VIC AB 2-0 CT1 TAPERPNT 27 (SUTURE) ×1 IMPLANT
SUTURE FIBERWR#2 38 REV NDL BL (SUTURE) IMPLANT
TAPE LABRALWHITE 1.5X36 (TAPE) ×1 IMPLANT
TAPE SUT LABRALTAP WHT/BLK (SUTURE) ×1 IMPLANT
TOWEL OR 17X26 10 PK STRL BLUE (TOWEL DISPOSABLE) ×2 IMPLANT
TOWEL OR NON WOVEN STRL DISP B (DISPOSABLE) ×2 IMPLANT
TUBE SUCTION HIGH CAP CLEAR NV (SUCTIONS) ×1 IMPLANT
WATER STERILE IRR 1000ML POUR (IV SOLUTION) ×3 IMPLANT

## 2022-04-03 NOTE — Anesthesia Procedure Notes (Signed)
Anesthesia Regional Block: Interscalene brachial plexus block  ? ?Pre-Anesthetic Checklist: , timeout performed,  Correct Patient, Correct Site, Correct Laterality,  Correct Procedure, Correct Position, site marked,  Risks and benefits discussed,  Pre-op evaluation,  At surgeon's request and post-op pain management ? ?Laterality: Right ? ?Prep: Maximum Sterile Barrier Precautions used, chloraprep     ?  ?Needles:  ?Injection technique: Single-shot ? ?Needle Type: Echogenic Stimulator Needle   ? ? ?Needle Length: 4cm  ?Needle Gauge: 22  ? ? ? ?Additional Needles: ? ? ?Procedures:,,,, ultrasound used (permanent image in chart),,    ?Narrative:  ?Start time: 04/03/2022 7:03 AM ?End time: 04/03/2022 7:07 AM ?Injection made incrementally with aspirations every 5 mL. ? ?Performed by: Personally  ?Anesthesiologist: Brennan Bailey, MD ? ?Additional Notes: ?Risks, benefits, and alternative discussed. Patient gave consent for procedure. Patient prepped and draped in sterile fashion. Sedation administered, patient remains easily responsive to voice. Relevant anatomy identified with ultrasound guidance. Local anesthetic given in 5cc increments with no signs or symptoms of intravascular injection. No pain or paraesthesias with injection. Patient monitored throughout procedure with signs of LAST or immediate complications. Tolerated well. Ultrasound image placed in chart.  ?Tawny Asal, MD ? ? ? ? ? ? ?

## 2022-04-03 NOTE — Op Note (Signed)
Procedure(s): ?REVERSE SHOULDER ARTHROPLASTY Procedure Note ? ?Diane [name] ?female ?71 y.o. ?04/03/2022 ? 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 ?Postoperative diagnosis: Same ? ?Procedure(s) and Anesthesia Type: ?   * REVERSE SHOULDER ARTHROPLASTY - Choice ? ? ?Indications:  71 y.o. female  With advanced R shoulder arthritis with significant rotator cuff disease. Pain and dysfunction interfered with quality of life and nonoperative treatment with activity modification, NSAIDS and injections failed. ?    ?Surgeon: Rhae Hammock  ? ?Assistants: Forensic psychologist was present and scrubbed throughout the procedure and was essential in positioning, retraction, exposure, and closure) ? ?Anesthesia: General endotracheal anesthesia with preoperative interscalene block given by the attending anesthesiologist ? ? ? ?Procedure Detail ? ?REVERSE SHOULDER ARTHROPLASTY ? ? ?Estimated Blood Loss:  200 mL ?        ?Drains: none ? ?Blood Given: none  ?        ?Specimens: none ?       ?Complications:  * No complications entered in OR log * ?        ?Disposition: PACU - hemodynamically stable. ?        ?Condition: stable ?   ? ? ?OPERATIVE FINDINGS:  ?A DJO Altivate pressfit reverse total shoulder arthroplasty was placed with a  ?size 10 stem, a 32-4 glenosphere, and a standard-mm poly insert. The base plate  ?fixation was excellent. ? ?PROCEDURE: The patient was identified in the preoperative holding area  ?where I personally marked the operative site after verifying site, side,  ?and procedure with the patient. An interscalene block given by  ?the attending anesthesiologist in the holding area and the patient was taken back to the operating room where all extremities were  ?carefully padded in position after general anesthesia was induced. She  ?was placed in a beach-chair position and the operative upper extremity was  ?prepped and draped in a standard sterile  fashion. An approximately 10-  ?cm incision was made from the tip of the coracoid process to the center  ?point of the humerus at the level of the axilla. Dissection was carried  ?down through subcutaneous tissues to the level of the cephalic vein  ?which was taken laterally with the deltoid. The pectoralis major was  ?retracted medially. The subdeltoid space was developed and the lateral  ?edge of the conjoined tendon was identified. The undersurface of  ?conjoined tendon was palpated and the musculocutaneous nerve was not in  ?the field. Retractor was placed underneath the conjoined and second  ?retractor was placed lateral into the deltoid. The circumflex humeral  ?artery and vessels were identified and clamped and coagulated. The  ?biceps tendon was tenodesed to the upper border of the pectoralis major.  The subscapularis was taken down as a peel with the underlying capsule.  The  ?joint was then gently externally rotated while the capsule was released  ?from the humeral neck around to just beyond the 6 o'clock position. At  ?this point, the joint was dislocated and the humeral head was presented  ?into the wound. The excessive osteophyte formation was removed with a  ?large rongeur.  The cutting guide was used to make the appropriate  ?head cut and the head was saved for potentially bone grafting.  The rotator cuff was degenerative appearing.  The anterior 10 to 15 mm of the supraspinatus was released.  The glenoid was exposed with the arm in an  ?abducted extended position. The anterior and posterior  labrum were  ?completely excised and the capsule was released circumferentially to  ?allow for exposure of the glenoid for preparation. The 2.5 mm drill was  ?placed using the guide in 5-10? inferior angulation and the tap was then advanced in the same hole. Small and large reamers were then used. The tap was then removed and the Metaglene was then screwed in with excellent purchase.  The peripheral guide was then  used to drilled measured and filled peripheral locking screws. The size 32-4 glenosphere was then impacted on the [facility] taper and the central screw was placed. The humerus was then again exposed and the diaphyseal reamers were used followed by the metaphyseal reamers. The final broach was left in place in the proximal trial was placed. The joint was reduced and with this implant it was felt that soft tissue tensioning was appropriate with excellent stability and excellent range of motion. Therefore, final humeral stem was placed press-fit.  And then the trial polyethylene inserts were tested again and the above implant was felt to be the most appropriate for final insertion. The joint was reduced taken through full range of motion and felt to be stable. Soft tissue tension was appropriate.  ?The joint was then copiously irrigated with pulse  ?lavage and the wound was then closed. The subscapularis was repaired with 2 suture tapes through predrilled bone tunnels.  Skin was closed with 2-0 Vicryl in a deep dermal layer and 4-0  ?Monocryl for skin closure. Steri-Strips were applied. Sterile  ?dressings were then applied as well as a sling. The patient was allowed  ?to awaken from general anesthesia, transferred to stretcher, and taken  ?to recovery room in stable condition.  ? ?POSTOPERATIVE PLAN: The patient will be observed in the recovery room and if her pain is well controlled with the regional anesthesia and she is hemodynamically stable she can be discharged home today with family. ?

## 2022-04-03 NOTE — Anesthesia Postprocedure Evaluation (Signed)
Anesthesia Post Note ? ?Patient: Diane [name] ? ?Procedure(s) Performed: REVERSE SHOULDER ARTHROPLASTY (Right: Shoulder) ? ?  ? ?Patient location during evaluation: PACU ?Anesthesia Type: General ?Level of consciousness: awake and alert ?Pain management: pain level controlled ?Vital Signs Assessment: post-procedure vital signs reviewed and stable ?Respiratory status: spontaneous breathing, nonlabored ventilation and respiratory function stable ?Cardiovascular status: blood pressure returned to baseline ?Postop Assessment: no apparent nausea or vomiting ?Anesthetic complications: no ? ? ?No notable events documented. ? ?Last Vitals:  ?Vitals:  ? 04/03/22 0900 04/03/22 0915  ?BP: (!) 142/82 133/74  ?Pulse: 100 86  ?Resp: 15   ?Temp: 36.4 ?C   ?SpO2: 93% 91%  ?  ?Last Pain:  ?Vitals:  ? 04/03/22 0900  ?TempSrc:   ?PainSc: 0-No pain  ? ? ?  ?  ?  ?  ?  ?  ? ?Marthenia Rolling ? ? ? ? ?

## 2022-04-03 NOTE — Evaluation (Signed)
Occupational Therapy Evaluation ?Patient Details ?Name: Diane [name] ?MRN: [id number] ?DOB: 11/24/1951 ?Today's Date: 04/03/2022 ? ? ?History of Present Illness Patietn s/p right reverse TSA  ? ?Clinical Impression ?  ?Diane [name] is a 71 year old woman s/p reverse shoulder replacement without functional use of right dominant upper extremity secondary to effects of surgery and interscalene block and shoulder precautions. Therapist provided education and instruction to patient and spouse in regards to exercises, precautions, positioning, donning upper extremity clothing and bathing while maintaining shoulder precautions, ice and edema management and donning/doffing sling. Patient and spouse verbalized understanding and demonstrated as needed. Patient needed assistance to donn shirt, underwear, pants, socks and shoes and provided with instruction on compensatory strategies to perform ADLs. Patient to follow up with MD for further therapy needs.  ?  ?   ? ?Recommendations for follow up therapy are one component of a multi-disciplinary discharge planning process, led by the attending physician.  Recommendations may be updated based on patient status, additional functional criteria and insurance authorization.  ? ?Follow Up Recommendations ? Follow physician's recommendations for discharge plan and follow up therapies  ?  ?Assistance Recommended at Discharge Intermittent Supervision/Assistance  ?Patient can return home with the following A little help with bathing/dressing/bathroom;Assistance with cooking/housework ? ?  ?Functional Status Assessment ? Patient has had a recent decline in their functional status and demonstrates the ability to make significant improvements in function in a reasonable and predictable amount of time.  ?Equipment Recommendations ? None recommended by OT  ?  ?Recommendations for Other Services   ? ? ?  ?Precautions / Restrictions Precautions ?Precautions: Shoulder ?Type of Shoulder  Precautions: No AROM, No PROM ?Shoulder Interventions: At all times;Off for dressing/bathing/exercises;Shoulder sling/immobilizer ?Precaution Booklet Issued:  (handouts) ?Restrictions ?Weight Bearing Restrictions: Yes ?RUE Weight Bearing: Non weight bearing  ? ?  ? ?Mobility Bed Mobility ?  ?  ?  ?  ?  ?  ?  ?  ?  ? ?Transfers ?  ?  ?  ?  ?  ?  ?  ?  ?  ?  ?  ? ?  ?Balance Overall balance assessment: No apparent balance deficits (not formally assessed) ?  ?  ?  ?  ?  ?  ?  ?  ?  ?  ?  ?  ?  ?  ?  ?  ?  ?  ?   ? ?ADL either performed or assessed with clinical judgement  ? ?ADL   ?  ?  ?  ?  ?  ?  ?  ?  ?  ?  ?  ?  ?  ?  ?  ?  ?  ?  ?  ?   ? ? ? ?Vision Patient Visual Report: No change from baseline ?   ?   ?Perception   ?  ?Praxis   ?  ? ?Pertinent Vitals/Pain Pain Assessment ?Pain Assessment: No/denies pain  ? ? ? ?Hand Dominance   ?  ?Extremity/Trunk Assessment Upper Extremity Assessment ?Upper Extremity Assessment: RUE deficits/detail ?RUE Deficits / Details: impaired sensation and motor control secondary to block ?  ?Lower Extremity Assessment ?Lower Extremity Assessment: Overall WFL for tasks assessed ?  ?Cervical / Trunk Assessment ?Cervical / Trunk Assessment: Normal ?  ?Communication   ?  ?Cognition Arousal/Alertness: Awake/alert ?Behavior During Therapy: [facility] for tasks assessed/performed ?Overall Cognitive Status: Within Functional Limits for tasks assessed ?  ?  ?  ?  ?  ?  ?  ?  ?  ?  ?  ?  ?  ?  ?  ?  ?  ?  ?  ?  General Comments    ? ?  ?Exercises   ?  ?Shoulder Instructions Shoulder Instructions ?Donning/doffing shirt without moving shoulder: Caregiver independent with task ?Method for sponge bathing under operated UE: Independent ?Donning/doffing sling/immobilizer: Caregiver independent with task ?Correct positioning of sling/immobilizer: Independent ?ROM for elbow, wrist and digits of operated UE: Independent ?Sling wearing schedule (on at all times/off for ADL's): Independent ?Proper positioning of  operated UE when showering: Independent ?Dressing change: Independent ?Positioning of UE while sleeping: Independent  ? ? ?Home Living Family/patient expects to be discharged to:: Private residence ?Living Arrangements: Spouse/significant other ?Available Help at Discharge: Family;Available 24 hours/day ?  ?  ?  ?  ?  ?  ?  ?  ?  ?  ?  ?  ?  ?  ?  ?  ? ?  ?Prior Functioning/Environment   ?  ?  ?  ?  ?  ?  ?  ?  ?  ? ?  ?  ?OT Problem List: Decreased strength;Decreased range of motion;Impaired UE functional use ?  ?   ?OT Treatment/Interventions:    ?  ?OT Goals(Current goals can be found in the care plan section) Acute Rehab OT Goals ?OT Goal Formulation: All assessment and education complete, DC therapy  ?OT Frequency:   ?  ? ?Co-evaluation   ?  ?  ?  ?  ? ?  ?AM-PAC OT "6 Clicks" Daily Activity     ?Outcome Measure Help from another person eating meals?: A Little ?Help from another person taking care of personal grooming?: A Little ?Help from another person toileting, which includes using toliet, bedpan, or urinal?: A Little ?Help from another person bathing (including washing, rinsing, drying)?: A Little ?Help from another person to put on and taking off regular upper body clothing?: A Lot ?Help from another person to put on and taking off regular lower body clothing?: A Little ?6 Click Score: 17 ?  ?End of Session Nurse Communication:  (Ot education complete) ? ?Activity Tolerance: Patient tolerated treatment well ?Patient left: in chair;with family/visitor present ? ?OT Visit Diagnosis: Muscle weakness (generalized) (M62.81)  ?              ?Time: 6629-4765 ?OT Time Calculation (min): 19 min ?Charges:  OT General Charges ?$OT Visit: 1 Visit ?OT Evaluation ?$OT Eval Low Complexity: 1 Low ? ?Carolann Brazell, OTR/L ?Acute Care Rehab Services  ?Office [phone] ?Pager: [phone]  ? ?Rhilee Currin L Noelene Gang ?04/03/2022, 10:57 AM ?

## 2022-04-03 NOTE — Discharge Instructions (Addendum)

## 2022-04-03 NOTE — Transfer of Care (Signed)
Immediate Anesthesia Transfer of Care Note ? ?Patient: Diane [name] ? ?Procedure(s) Performed: REVERSE SHOULDER ARTHROPLASTY (Right: Shoulder) ? ?Patient Location: PACU ? ?Anesthesia Type:General and Regional ? ?Level of Consciousness: awake, alert  and oriented ? ?Airway & Oxygen Therapy: Patient Spontanous Breathing and Patient connected to face mask oxygen ? ?Post-op Assessment: Report given to RN and Post -op Vital signs reviewed and stable ? ?Post vital signs: Reviewed and stable ? ?Last Vitals:  ?Vitals Value Taken Time  ?BP    ?Temp    ?Pulse    ?Resp    ?SpO2    ? ? ?Last Pain:  ?Vitals:  ? 04/03/22 0557  ?TempSrc: Oral  ?PainSc:   ?   ? ?  ? ?Complications: No notable events documented. ?

## 2022-04-03 NOTE — H&P (Signed)
Diane [name] is an 71 y.o. female.   ?Chief Complaint: R shoulder pain and dysfunction ?HPI: Advanced R shoulder arthritis with significant rotator cuff disease.  pain and dysfunction, failed conservative measures.  Pain interferes with sleep and quality of life. ? ? ?Past Medical History:  ?Diagnosis Date  ? Allergy   ? Arthritis   ? Chicken pox   ? GERD (gastroesophageal reflux disease)   ? Knee pain   ? Seasonal allergic rhinitis   ? Vitamin D deficiency   ? ? ?Past Surgical History:  ?Procedure Laterality Date  ? COLONOSCOPY  2009, 02/09/2017  ? [place], 2005  ? POLYPECTOMY    ? UPPER GASTROINTESTINAL ENDOSCOPY    ? wisdom teeth exraction    ? ? ?Family History  ?Problem Relation Age of Onset  ? Stroke Mother   ? Heart disease Mother   ? Heart attack Mother   ? Heart disease Father   ? Aortic aneurysm Father   ? Dementia Father   ? Colon cancer Neg Hx   ? Stomach cancer Neg Hx   ? Esophageal cancer Neg Hx   ? Pancreatic cancer Neg Hx   ? Rectal cancer Neg Hx   ? Colon polyps Neg Hx   ? ?Social History:  reports that she has never smoked. She has never used smokeless tobacco. She reports that she does not drink alcohol and does not use drugs. ? ?Allergies: No Known Allergies ? ?Medications Prior to Admission  ?Medication Sig Dispense Refill  ? Biotin 1000 MCG tablet Take 1,000 mcg by mouth daily.    ? Calcium Carb-Cholecalciferol (CALCIUM 500+D3 PO) Take by mouth.    ? Cholecalciferol (VITAMIN D PO) Take by mouth.    ? fluticasone (FLONASE) 50 MCG/ACT nasal spray Place 2 sprays into both nostrils daily. (Patient taking differently: Place 2 sprays into both nostrils daily as needed.) 16 g 6  ? Multiple Vitamin (MULTIVITAMIN) tablet Take 1 tablet by mouth daily.    ? ? ?Results for orders placed or performed in visit on 04/01/22 (from the past 48 hour(s))  ?VITAMIN D 25 Hydroxy (Vit-D Deficiency, Fractures)     Status: None  ? Collection Time: 04/01/22  9:27 AM  ?Result Value Ref Range  ? VITD 46.74  30.00 - 100.00 ng/mL  ?TSH     Status: None  ? Collection Time: 04/01/22  9:27 AM  ?Result Value Ref Range  ? TSH 1.34 0.35 - 5.50 uIU/mL  ?Lipid panel     Status: Abnormal  ? Collection Time: 04/01/22  9:27 AM  ?Result Value Ref Range  ? Cholesterol 234 (H) 0 - 200 mg/dL  ?  Comment: ATP III Classification       Desirable:  < 200 mg/dL               Borderline High:  200 - 239 mg/dL          High:  > = 240 mg/dL  ? Triglycerides 170.0 (H) 0.0 - 149.0 mg/dL  ?  Comment: Normal:  <150 mg/dLBorderline High:  150 - 199 mg/dL  ? HDL 58.60 >39.00 mg/dL  ? VLDL 34.0 0.0 - 40.0 mg/dL  ? LDL Cholesterol 141 (H) 0 - 99 mg/dL  ? Total CHOL/HDL Ratio 4   ?  Comment:                Men          Women1/2 Average Risk  3.4          3.3Average Risk          5.0          4.42X Average Risk          9.6          7.13X Average Risk          15.0          11.0                      ? NonHDL 175.21   ?  Comment: NOTE:  Non-HDL goal should be 30 mg/dL higher than patient's LDL goal (i.e. LDL goal of < 70 mg/dL, would have non-HDL goal of < 100 mg/dL)  ?Comprehensive metabolic panel     Status: None  ? Collection Time: 04/01/22  9:27 AM  ?Result Value Ref Range  ? Sodium 140 135 - 145 mEq/L  ? Potassium 4.3 3.5 - 5.1 mEq/L  ? Chloride 104 96 - 112 mEq/L  ? CO2 28 19 - 32 mEq/L  ? Glucose, Bld 80 70 - 99 mg/dL  ? BUN 17 6 - 23 mg/dL  ? Creatinine, Ser 0.71 0.40 - 1.20 mg/dL  ? Total Bilirubin 0.4 0.2 - 1.2 mg/dL  ? Alkaline Phosphatase 85 39 - 117 U/L  ? AST 22 0 - 37 U/L  ? ALT 16 0 - 35 U/L  ? Total Protein 7.3 6.0 - 8.3 g/dL  ? Albumin 4.5 3.5 - 5.2 g/dL  ? GFR 86.70 >60.00 mL/min  ?  Comment: Calculated using the CKD-EPI Creatinine Equation (2021)  ? Calcium 9.9 8.4 - 10.5 mg/dL  ? ?No results found. ? ?Review of Systems  ?All other systems reviewed and are negative. ? ?Blood pressure 134/80, pulse 72, temperature (!) 97.5 ?F (36.4 ?C), temperature source Oral, resp. rate 16, height '5\' 7"'$  (1.702 m), weight 70.3 kg, SpO2 97  %. ?Physical Exam ?Eyes:  ?   Extraocular Movements: Extraocular movements intact.  ?Cardiovascular:  ?   Pulses: Normal pulses.  ?Musculoskeletal:  ?   Comments: R shoulder pain with limited ROM. NVID  ?Neurological:  ?   Mental Status: She is alert.  ?Psychiatric:     ?   Mood and Affect: Mood normal.  ?  ? ?Assessment/Plan ?Advanced R shoulder arthritis with significant rotator cuff disease.  pain and dysfunction, failed conservative measures.  Pain interferes with sleep and quality of life. ?Plan R reverse TSA ?Risks / benefits of surgery discussed ?Consent on chart  ?NPO for OR ?Preop antibiotics ? ? ?Rhae Hammock, MD ?04/03/2022, 7:10 AM ? ? ? ?

## 2022-04-03 NOTE — Anesthesia Procedure Notes (Signed)
Procedure Name: Intubation ?Date/Time: 04/03/2022 7:24 AM ?Performed by: Demita Tobia D, CRNA ?Pre-anesthesia Checklist: Patient identified, Emergency Drugs available, Suction available and Patient being monitored ?Patient Re-evaluated:Patient Re-evaluated prior to induction ?Oxygen Delivery Method: Circle system utilized ?Preoxygenation: Pre-oxygenation with 100% oxygen ?Induction Type: IV induction ?Ventilation: Mask ventilation without difficulty ?Laryngoscope Size: Mac and 3 ?Grade View: Grade I ?Tube type: Oral ?Tube size: 7.0 mm ?Number of attempts: 1 ?Airway Equipment and Method: Stylet ?Placement Confirmation: ETT inserted through vocal cords under direct vision, positive ETCO2 and breath sounds checked- equal and bilateral ?Secured at: 21 cm ?Tube secured with: Tape ?Dental Injury: Teeth and Oropharynx as per pre-operative assessment  ? ? ? ? ?

## 2022-04-04 ENCOUNTER — Encounter (HOSPITAL_COMMUNITY): Payer: Self-pay | Admitting: Orthopedic Surgery

## 2022-04-18 DIAGNOSIS — Z96611 Presence of right artificial shoulder joint: Secondary | ICD-10-CM | POA: Diagnosis not present

## 2022-04-18 DIAGNOSIS — Z9889 Other specified postprocedural states: Secondary | ICD-10-CM | POA: Diagnosis not present

## 2022-04-18 DIAGNOSIS — M19011 Primary osteoarthritis, right shoulder: Secondary | ICD-10-CM | POA: Diagnosis not present

## 2022-05-08 DIAGNOSIS — H40033 Anatomical narrow angle, bilateral: Secondary | ICD-10-CM | POA: Diagnosis not present

## 2022-05-08 DIAGNOSIS — H2513 Age-related nuclear cataract, bilateral: Secondary | ICD-10-CM | POA: Diagnosis not present

## 2022-05-27 DIAGNOSIS — M25511 Pain in right shoulder: Secondary | ICD-10-CM | POA: Diagnosis not present

## 2022-05-27 DIAGNOSIS — M25611 Stiffness of right shoulder, not elsewhere classified: Secondary | ICD-10-CM | POA: Diagnosis not present

## 2022-05-29 DIAGNOSIS — M25511 Pain in right shoulder: Secondary | ICD-10-CM | POA: Diagnosis not present

## 2022-05-29 DIAGNOSIS — M25611 Stiffness of right shoulder, not elsewhere classified: Secondary | ICD-10-CM | POA: Diagnosis not present

## 2022-06-03 DIAGNOSIS — M25611 Stiffness of right shoulder, not elsewhere classified: Secondary | ICD-10-CM | POA: Diagnosis not present

## 2022-06-03 DIAGNOSIS — M25511 Pain in right shoulder: Secondary | ICD-10-CM | POA: Diagnosis not present

## 2022-06-05 DIAGNOSIS — M25511 Pain in right shoulder: Secondary | ICD-10-CM | POA: Diagnosis not present

## 2022-06-05 DIAGNOSIS — M25611 Stiffness of right shoulder, not elsewhere classified: Secondary | ICD-10-CM | POA: Diagnosis not present

## 2022-06-09 DIAGNOSIS — Z1231 Encounter for screening mammogram for malignant neoplasm of breast: Secondary | ICD-10-CM | POA: Diagnosis not present

## 2022-06-11 DIAGNOSIS — M25611 Stiffness of right shoulder, not elsewhere classified: Secondary | ICD-10-CM | POA: Diagnosis not present

## 2022-06-11 DIAGNOSIS — M25511 Pain in right shoulder: Secondary | ICD-10-CM | POA: Diagnosis not present

## 2022-06-13 DIAGNOSIS — M25511 Pain in right shoulder: Secondary | ICD-10-CM | POA: Diagnosis not present

## 2022-06-13 DIAGNOSIS — M25611 Stiffness of right shoulder, not elsewhere classified: Secondary | ICD-10-CM | POA: Diagnosis not present

## 2022-06-24 DIAGNOSIS — M25611 Stiffness of right shoulder, not elsewhere classified: Secondary | ICD-10-CM | POA: Diagnosis not present

## 2022-06-24 DIAGNOSIS — M25511 Pain in right shoulder: Secondary | ICD-10-CM | POA: Diagnosis not present

## 2022-06-30 DIAGNOSIS — Z471 Aftercare following joint replacement surgery: Secondary | ICD-10-CM | POA: Diagnosis not present

## 2022-06-30 DIAGNOSIS — Z96611 Presence of right artificial shoulder joint: Secondary | ICD-10-CM | POA: Diagnosis not present

## 2022-07-01 DIAGNOSIS — M25611 Stiffness of right shoulder, not elsewhere classified: Secondary | ICD-10-CM | POA: Diagnosis not present

## 2022-07-01 DIAGNOSIS — M25511 Pain in right shoulder: Secondary | ICD-10-CM | POA: Diagnosis not present

## 2022-07-04 DIAGNOSIS — M25611 Stiffness of right shoulder, not elsewhere classified: Secondary | ICD-10-CM | POA: Diagnosis not present

## 2022-07-04 DIAGNOSIS — M25511 Pain in right shoulder: Secondary | ICD-10-CM | POA: Diagnosis not present

## 2022-07-08 DIAGNOSIS — M25611 Stiffness of right shoulder, not elsewhere classified: Secondary | ICD-10-CM | POA: Diagnosis not present

## 2022-07-08 DIAGNOSIS — M25511 Pain in right shoulder: Secondary | ICD-10-CM | POA: Diagnosis not present

## 2022-07-10 DIAGNOSIS — M25611 Stiffness of right shoulder, not elsewhere classified: Secondary | ICD-10-CM | POA: Diagnosis not present

## 2022-08-11 ENCOUNTER — Ambulatory Visit (INDEPENDENT_AMBULATORY_CARE_PROVIDER_SITE_OTHER): Payer: Medicare PPO

## 2022-08-11 VITALS — Ht 67.0 in | Wt 152.0 lb

## 2022-08-11 DIAGNOSIS — Z0000 Encounter for general adult medical examination without abnormal findings: Secondary | ICD-10-CM

## 2022-08-11 DIAGNOSIS — Z780 Asymptomatic menopausal state: Secondary | ICD-10-CM

## 2022-08-11 NOTE — Progress Notes (Signed)
Subjective:   Diane [name] is a 71 y.o. female who presents for Medicare Annual (Subsequent) preventive examination.   Virtual Visit via Telephone Note  I connected with  Jason Coop on 08/11/22 at 11:15 AM EDT by telephone and verified that I am speaking with the correct person using two identifiers.  Location: Patient: home  Provider: [place]  Persons participating in the virtual visit: patient/Nurse Health Advisor   I discussed the limitations, risks, security and privacy concerns of performing an evaluation and management service by telephone and the availability of in person appointments. The patient expressed understanding and agreed to proceed.  Interactive audio and video telecommunications were attempted between this nurse and patient, however failed, due to patient having technical difficulties OR patient did not have access to video capability.  We continued and completed visit with audio only.  Some vital signs may be absent or patient reported.   Daphane Shepherd, LPN  Review of Systems     Cardiac Risk Factors include: advanced age (>6mn, >>13women)     Objective:    Today's Vitals   08/11/22 1132  Weight: 152 lb (68.9 kg)  Height: '5\' 7"'$  (1.702 m)   Body mass index is 23.81 kg/m.     08/11/2022   11:39 AM 03/28/2022    9:15 AM 08/09/2021    2:30 PM  Advanced Directives  Does Patient Have a Medical Advance Directive? Yes No Yes  [place]  Living will  Does patient want to make changes to medical advance directive?   No - Patient declined  Copy of [place] Chart? No - copy requested      Current Medications (verified) Outpatient Encounter Medications as of 08/11/2022  Medication Sig   Biotin 1000 MCG tablet Take 1,000 mcg by mouth daily.   Calcium Carb-Cholecalciferol (CALCIUM 500+D3 PO) Take by mouth.   Cholecalciferol (VITAMIN D PO) Take by mouth.    fluticasone (FLONASE) 50 MCG/ACT nasal spray Place 2 sprays into both nostrils daily. (Patient taking differently: Place 2 sprays into both nostrils daily as needed.)   Multiple Vitamin (MULTIVITAMIN) tablet Take 1 tablet by mouth daily.   oxyCODONE-acetaminophen (PERCOCET) 5-325 MG tablet Take 1 tablet by mouth every 6 (six) hours as needed for severe pain. (Patient not taking: Reported on 08/11/2022)   tiZANidine (ZANAFLEX) 2 MG tablet Take 1 tablet (2 mg total) by mouth every 8 (eight) hours as needed for muscle spasms. (Patient not taking: Reported on 08/11/2022)   No facility-administered encounter medications on file as of 08/11/2022.    Allergies (verified) Patient has no known allergies.   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   Procedure: REVERSE SHOULDER ARTHROPLASTY;  Surgeon: CTania Ade MD;  Location: [facility];  Service: Orthopedics;  Laterality: Right;   UPPER GASTROINTESTINAL ENDOSCOPY     wisdom teeth exraction     Family History  Problem Relation Age of Onset   Stroke Mother    Heart disease Mother    Heart attack Mother  Heart disease Father    Aortic aneurysm Father    Dementia Father    Colon cancer Neg Hx    Stomach cancer Neg Hx    Esophageal cancer Neg Hx    Pancreatic cancer Neg Hx    Rectal cancer Neg Hx    Colon polyps Neg Hx    Social History   Socioeconomic History   Marital status: Married    Spouse name: Not on file   Number of children: Not on file   Years of education: Not on file   Highest education level: Not on file  Occupational History   Not on file  Tobacco Use   Smoking status: Never    Smokeless tobacco: Never  Vaping Use   Vaping Use: Never used  Substance and Sexual Activity   Alcohol use: No   Drug use: No   Sexual activity: Yes    Partners: Male  Other Topics Concern   Not on file  Social History Narrative   Not on file   Social Determinants of Health   Financial Resource Strain: Low Risk  (08/11/2022)   Overall Financial Resource Strain (CARDIA)    Difficulty of Paying Living Expenses: Not hard at all  Food Insecurity: No Food Insecurity (08/11/2022)   Hunger Vital Sign    Worried About Running Out of Food in the Last Year: Never true    Ran Out of Food in the Last Year: Never true  Transportation Needs: No Transportation Needs (08/11/2022)   PRAPARE - Hydrologist (Medical): No    Lack of Transportation (Non-Medical): No  Physical Activity: Insufficiently Active (08/11/2022)   Exercise Vital Sign    Days of Exercise per Week: 4 days    Minutes of Exercise per Session: 20 min  Stress: No Stress Concern Present (08/11/2022)   [place]    Feeling of Stress : Not at all  Social Connections: [place] (08/11/2022)   Social Connection and Isolation Panel [NHANES]    Frequency of Communication with Friends and Family: More than three times a week    Frequency of Social Gatherings with Friends and Family: More than three times a week    Attends Religious Services: More than 4 times per year    Active Member of [employer] or Organizations: Yes    Attends Music therapist: More than 4 times per year    Marital Status: Married    Tobacco Counseling Counseling given: Not Answered   Clinical Intake:  Pre-visit preparation completed: Yes  Pain : No/denies pain     Diabetes: No  How often do you need to have someone help you when you read instructions, pamphlets, or other written materials from your doctor or pharmacy?: 1 - Never What is the  last grade level you completed in school?: college  Diabetic?no   Interpreter Needed?: No  Information entered by :: [place] [facility] [employer]    08/11/2022   11:39 AM 03/28/2022    9:14 AM  In your present state of health, do you have any difficulty performing the following activities:  Hearing? 0 0  Vision? 0 0  Difficulty concentrating or making decisions? 0 0  Walking or climbing stairs? 0 0  Dressing or bathing? 0 0  Doing errands, shopping? 0   Preparing Food and eating ? N   Using the Toilet? N   In the past six months, have  you accidently leaked urine? N   Do you have problems with loss of bowel control? N   Managing your Medications? N   Managing your Finances? N   Housekeeping or managing your Housekeeping? N     Patient Care Team: Binnie Rail, MD as PCP - General (Internal Medicine) Vanessa Kick, MD as Consulting Physician (Obstetrics and Gynecology)  Indicate any recent Medical Services you may have received from other than [facility] providers in the past year (date may be approximate).     Assessment:   This is a routine wellness examination for [place].  Hearing/Vision screen Vision Screening - Comments:: Annual eye exams wears glasses  [facility]   Dietary issues and exercise activities discussed: Current Exercise Habits: Home exercise routine, Type of exercise: strength training/weights, Time (Minutes): 20, Frequency (Times/Week): 4, Weekly Exercise (Minutes/Week): 80, Intensity: Mild, Exercise limited by: orthopedic condition(s)   Goals Addressed               This Visit's Progress     Patient Stated (pt-stated)   On track     My goal is to keep my weight around 140 pounds.       Depression Screen    08/11/2022   11:47 AM 08/11/2022   11:37 AM 08/09/2021    2:35 PM 02/18/2021    9:32 AM 01/24/2019   11:21 AM  PHQ 2/9 Scores  PHQ - 2 Score 0 0 0 0 0    Fall Risk    08/11/2022   11:34 AM 08/09/2021    2:31 PM  [place] in the past year? 0 0  Number falls in past yr: 0 0  Injury with Fall? 0 0  Risk for fall due to :  No Fall Risks  Follow up Falls evaluation completed;Education provided Falls evaluation completed    [place]:  Any stairs in or around the home? Yes  If so, are there any without handrails? No  Home free of loose throw rugs in walkways, pet beds, electrical cords, etc? Yes  Adequate lighting in your home to reduce risk of falls? Yes   ASSISTIVE DEVICES UTILIZED TO PREVENT FALLS:  Life alert? No  Use of a cane, walker or w/c? No  Grab bars in the bathroom? No  Shower chair or bench in shower? No  Elevated toilet seat or a handicapped toilet? No        08/11/2022   11:46 AM  6CIT Screen  What Year? 0 points  What month? 0 points  What time? 0 points  Count back from 20 0 points  Months in reverse 0 points  Repeat phrase 0 points  Total Score 0 points    Immunizations Immunization History  Administered Date(s) Administered   Fluad Quad(high Dose 65+) 09/22/2019, 09/07/2020, 09/18/2021   Influenza,inj,Quad PF,6+ Mos 10/08/2016, 09/22/2019   Influenza-Unspecified 10/09/2017, 08/30/2018   PFIZER Comirnaty(Gray Top)Covid-19 Tri-Sucrose Vaccine 06/06/2021   PFIZER(Purple Top)SARS-COV-2 Vaccination 01/03/2020, 01/24/2020, 09/29/2020   Pneumococcal Conjugate-13 09/07/2020   Tdap 07/15/2018   Zoster, Live 03/18/2014    TDAP status: Up to date  Flu Vaccine status: Up to date  Pneumococcal vaccine status: Up to date  Covid-19 vaccine status: Completed vaccines  Qualifies for Shingles Vaccine? Yes   Zostavax completed No   Shingrix Completed?: No.    Education has been provided regarding the importance of this vaccine. Patient has been advised to call insurance company to determine out of pocket  expense if they have not yet received this vaccine. Advised may also receive vaccine at local pharmacy or Health Dept. Verbalized  acceptance and understanding.  Screening Tests Health Maintenance  Topic Date Due   Zoster Vaccines- Shingrix (1 of 2) Never done   MAMMOGRAM  12/29/2019   COVID-19 Vaccine (5 - Pfizer series) 08/01/2021   INFLUENZA VACCINE  07/15/2022   Pneumonia Vaccine 55+ Years old (2 - PPSV23 or PCV20) 04/02/2023 (Originally 09/07/2021)   DEXA SCAN  04/02/2023 (Originally 02/04/2021)   COLONOSCOPY (Pts 45-54yr Insurance coverage will need to be confirmed)  04/17/2027   TETANUS/TDAP  07/15/2028   HPV VACCINES  Aged Out   Hepatitis C Screening  Discontinued    Health Maintenance  Health Maintenance Due  Topic Date Due   Zoster Vaccines- Shingrix (1 of 2) Never done   MAMMOGRAM  12/29/2019   COVID-19 Vaccine (5 - Pfizer series) 08/01/2021   INFLUENZA VACCINE  07/15/2022    Colorectal cancer screening: Type of screening: Colonoscopy. Completed 04/16/2020. Repeat every 5 years  Mammogram status: Completed 05/2022. Repeat every year  Bone Density status: Ordered 08/11/2022. Pt provided with contact info and advised to call to schedule appt.  Lung Cancer Screening: (Low Dose CT Chest recommended if Age 70-80years, 30 pack-year currently smoking OR have quit w/in 15years.) does not qualify.   Lung Cancer Screening Referral: n/a  Additional Screening:  Hepatitis C Screening: does not qualify;   Vision Screening: Recommended annual ophthalmology exams for early detection of glaucoma and other disorders of the eye. Is the patient up to date with their annual eye exam?  Yes  Who is the provider or what is the name of the office in which the patient attends annual eye exams? [facility]  If pt is not established with a provider, would they like to be referred to a provider to establish care? No .   Dental Screening: Recommended annual dental exams for proper oral hygiene  Community Resource Referral / Chronic Care Management: CRR required this visit?  No   CCM required this visit?  No       Plan:     I have personally reviewed and noted the following in the patient's chart:   Medical and social history Use of alcohol, tobacco or illicit drugs  Current medications and supplements including opioid prescriptions. Patient is not currently taking opioid prescriptions. Functional ability and status Nutritional status Physical activity Advanced directives List of other physicians Hospitalizations, surgeries, and ER visits in previous 12 months Vitals Screenings to include cognitive, depression, and falls Referrals and appointments  In addition, I have reviewed and discussed with patient certain preventive protocols, quality metrics, and best practice recommendations. A written personalized care plan for preventive services as well as general preventive health recommendations were provided to patient.     LDaphane Shepherd LPN   [id number]  Nurse Notes: Aim for 30 minutes of exercise or brisk walking, 6-8 glasses of water, and 5 servings of fruits and vegetables each day.

## 2022-08-11 NOTE — Patient Instructions (Signed)
Diane [name] , Thank you for taking time to come for your Medicare Wellness Visit. I appreciate your ongoing commitment to your health goals. Please review the following plan we discussed and let me know if I can assist you in the future.   Screening recommendations/referrals: Colonoscopy: 04/16/2020  due 04/2027 Mammogram: 05/2022 Bone Density: referrral 08/11/2022 Recommended yearly ophthalmology/optometry visit for glaucoma screening and checkup Recommended yearly dental visit for hygiene and checkup  Vaccinations: Influenza vaccine: completed  Pneumococcal vaccine: completed  Tdap vaccine: 07/15/2018 Shingles vaccine: will consider    Covid-19:completed   Advanced directives: yes   Conditions/risks identified: Aim for 30 minutes of exercise or brisk walking, 6-8 glasses of water, and 5 servings of fruits and vegetables each day.   Next appointment: Follow up in one year for your annual wellness visit    Preventive Care 70 Years and Older, Female Preventive care refers to lifestyle choices and visits with your health care provider that can promote health and wellness. What does preventive care include? A yearly physical exam. This is also called an annual well check. Dental exams once or twice a year. Routine eye exams. Ask your health care provider how often you should have your eyes checked. Personal lifestyle choices, including: Daily care of your teeth and gums. Regular physical activity. Eating a healthy diet. Avoiding tobacco and drug use. Limiting alcohol use. Practicing safe sex. Taking low-dose aspirin every day. Taking vitamin and mineral supplements as recommended by your health care provider. What happens during an annual well check? The services and screenings done by your health care provider during your annual well check will depend on your age, overall health, lifestyle risk factors, and family history of disease. Counseling  Your health care provider may ask  you questions about your: Alcohol use. Tobacco use. Drug use. Emotional well-being. Home and relationship well-being. Sexual activity. Eating habits. History of falls. Memory and ability to understand (cognition). Work and work Statistician. Reproductive health. Screening  You may have the following tests or measurements: Height, weight, and BMI. Blood pressure. Lipid and cholesterol levels. These may be checked every 5 years, or more frequently if you are over 60 years old. Skin check. Lung cancer screening. You may have this screening every year starting at age 70 if you have a 30-pack-year history of smoking and currently smoke or have quit within the past 15 years. Fecal occult blood test (FOBT) of the stool. You may have this test every year starting at age 70. Flexible sigmoidoscopy or colonoscopy. You may have a sigmoidoscopy every 5 years or a colonoscopy every 10 years starting at age 70. Hepatitis C blood test. Hepatitis B blood test. Sexually transmitted disease (STD) testing. Diabetes screening. This is done by checking your blood sugar (glucose) after you have not eaten for a while (fasting). You may have this done every 1-3 years. Bone density scan. This is done to screen for osteoporosis. You may have this done starting at age 18. Mammogram. This may be done every 1-2 years. Talk to your health care provider about how often you should have regular mammograms. Talk with your health care provider about your test results, treatment options, and if necessary, the need for more tests. Vaccines  Your health care provider may recommend certain vaccines, such as: Influenza vaccine. This is recommended every year. Tetanus, diphtheria, and acellular pertussis (Tdap, Td) vaccine. You may need a Td booster every 10 years. Zoster vaccine. You may need this after age 40. Pneumococcal 13-valent conjugate (  PCV13) vaccine. One dose is recommended after age 70. Pneumococcal  polysaccharide (PPSV23) vaccine. One dose is recommended after age 8. Talk to your health care provider about which screenings and vaccines you need and how often you need them. This information is not intended to replace advice given to you by your health care provider. Make sure you discuss any questions you have with your health care provider. Document Released: 12/28/2015 Document Revised: 08/20/2016 Document Reviewed: 10/02/2015 Elsevier Interactive Patient Education  2017 [employer] Prevention in the Home Falls can cause injuries. They can happen to people of all ages. There are many things you can do to make your home safe and to help prevent falls. What can I do on the outside of my home? Regularly fix the edges of walkways and driveways and fix any cracks. Remove anything that might make you trip as you walk through a door, such as a raised step or threshold. Trim any bushes or trees on the path to your home. Use bright outdoor lighting. Clear any walking paths of anything that might make someone trip, such as rocks or tools. Regularly check to see if handrails are loose or broken. Make sure that both sides of any steps have handrails. Any raised decks and porches should have guardrails on the edges. Have any leaves, snow, or ice cleared regularly. Use sand or salt on walking paths during winter. Clean up any spills in your garage right away. This includes oil or grease spills. What can I do in the bathroom? Use night lights. Install grab bars by the toilet and in the tub and shower. Do not use towel bars as grab bars. Use non-skid mats or decals in the tub or shower. If you need to sit down in the shower, use a plastic, non-slip stool. Keep the floor dry. Clean up any water that spills on the floor as soon as it happens. Remove soap buildup in the tub or shower regularly. Attach bath mats securely with double-sided non-slip rug tape. Do not have throw rugs and other  things on the floor that can make you trip. What can I do in the bedroom? Use night lights. Make sure that you have a light by your bed that is easy to reach. Do not use any sheets or blankets that are too big for your bed. They should not hang down onto the floor. Have a firm chair that has side arms. You can use this for support while you get dressed. Do not have throw rugs and other things on the floor that can make you trip. What can I do in the kitchen? Clean up any spills right away. Avoid walking on wet floors. Keep items that you use a lot in easy-to-reach places. If you need to reach something above you, use a strong step stool that has a grab bar. Keep electrical cords out of the way. Do not use floor polish or wax that makes floors slippery. If you must use wax, use non-skid floor wax. Do not have throw rugs and other things on the floor that can make you trip. What can I do with my stairs? Do not leave any items on the stairs. Make sure that there are handrails on both sides of the stairs and use them. Fix handrails that are broken or loose. Make sure that handrails are as long as the stairways. Check any carpeting to make sure that it is firmly attached to the stairs. Fix any carpet that is  loose or worn. Avoid having throw rugs at the top or bottom of the stairs. If you do have throw rugs, attach them to the floor with carpet tape. Make sure that you have a light switch at the top of the stairs and the bottom of the stairs. If you do not have them, ask someone to add them for you. What else can I do to help prevent falls? Wear shoes that: Do not have high heels. Have rubber bottoms. Are comfortable and fit you well. Are closed at the toe. Do not wear sandals. If you use a stepladder: Make sure that it is fully opened. Do not climb a closed stepladder. Make sure that both sides of the stepladder are locked into place. Ask someone to hold it for you, if possible. Clearly  mark and make sure that you can see: Any grab bars or handrails. First and last steps. Where the edge of each step is. Use tools that help you move around (mobility aids) if they are needed. These include: Canes. Walkers. Scooters. Crutches. Turn on the lights when you go into a dark area. Replace any light bulbs as soon as they burn out. Set up your furniture so you have a clear path. Avoid moving your furniture around. If any of your floors are uneven, fix them. If there are any pets around you, be aware of where they are. Review your medicines with your doctor. Some medicines can make you feel dizzy. This can increase your chance of falling. Ask your doctor what other things that you can do to help prevent falls. This information is not intended to replace advice given to you by your health care provider. Make sure you discuss any questions you have with your health care provider. Document Released: 09/27/2009 Document Revised: 05/08/2016 Document Reviewed: 01/05/2015 Elsevier Interactive Patient Education  2017 [employer].

## 2022-09-02 DIAGNOSIS — L298 Other pruritus: Secondary | ICD-10-CM | POA: Diagnosis not present

## 2022-09-02 DIAGNOSIS — L821 Other seborrheic keratosis: Secondary | ICD-10-CM | POA: Diagnosis not present

## 2022-09-02 DIAGNOSIS — L820 Inflamed seborrheic keratosis: Secondary | ICD-10-CM | POA: Diagnosis not present

## 2022-09-02 DIAGNOSIS — D225 Melanocytic nevi of trunk: Secondary | ICD-10-CM | POA: Diagnosis not present

## 2022-09-02 DIAGNOSIS — D492 Neoplasm of unspecified behavior of bone, soft tissue, and skin: Secondary | ICD-10-CM | POA: Diagnosis not present

## 2022-09-02 DIAGNOSIS — L814 Other melanin hyperpigmentation: Secondary | ICD-10-CM | POA: Diagnosis not present

## 2022-09-02 DIAGNOSIS — R58 Hemorrhage, not elsewhere classified: Secondary | ICD-10-CM | POA: Diagnosis not present

## 2022-09-02 DIAGNOSIS — L570 Actinic keratosis: Secondary | ICD-10-CM | POA: Diagnosis not present

## 2022-09-10 ENCOUNTER — Ambulatory Visit (INDEPENDENT_AMBULATORY_CARE_PROVIDER_SITE_OTHER)
Admission: RE | Admit: 2022-09-10 | Discharge: 2022-09-10 | Disposition: A | Discharge status: Home / Self Care | Payer: Medicare PPO | Source: Ambulatory Visit

## 2022-09-10 DIAGNOSIS — Z780 Asymptomatic menopausal state: Secondary | ICD-10-CM

## 2022-09-29 ENCOUNTER — Ambulatory Visit: Payer: Medicare PPO | Admitting: Internal Medicine

## 2022-09-29 ENCOUNTER — Encounter: Payer: Self-pay | Admitting: Internal Medicine

## 2022-09-29 VITALS — BP 118/78 | HR 62 | Temp 98.1°F | Ht 67.0 in | Wt 151.0 lb

## 2022-09-29 DIAGNOSIS — R1013 Epigastric pain: Secondary | ICD-10-CM

## 2022-09-29 MED ORDER — FAMOTIDINE 40 MG PO TABS: 40.0000 mg | ORAL_TABLET | Freq: Every day | ORAL | 5 refills | Status: DC

## 2022-09-29 NOTE — Patient Instructions (Addendum)
        Medications changes include :   pepcid 40 mg daily    A referral was ordered for Dr Havery Moros - Phone: [phone]     Return if symptoms worsen or fail to improve.

## 2022-09-29 NOTE — Assessment & Plan Note (Signed)
Acute Started about 10 days ago Epigastric-lower chest pain right below xiphoid process-pain is a pressure feeling and constant She is already adjusted her diet so I think that is helping No true dysphagia Has history of esophageal stricture that was dilated in 2018 2 cm hiatal hernia seen on EGD Possible esophagitis, esophageal stricture She would prefer not to take a PPI Start Pepcid 40 mg daily Referral to Dr. Havery Moros Continue to revise diet and eating Call if symptoms do not improve or worsen so that we can evaluate further, but at this time we will hold off on blood work and her sound

## 2022-09-29 NOTE — Progress Notes (Signed)
Subjective:    Patient ID: Diane [name], female    DOB: 06/04/1951, 71 y.o.   MRN: [id number]      HPI Shakendra is here for  Chief Complaint  Patient presents with   Abdominal Pain    Upper mid abdominal pain; patient wants to rule out hernia; Pain and discomfort x 1 week 1/2.     Started about 10 days ago - feels like a pushing sensation.  Worse when sits up real straight.  Sometimes feels it when laying down.  The pain is constant.  No gerd, but stomach feels upset at night.  She denies any dysphagia, changes in bowels, blood in stool or black stool.  No food makes it worse.   Still has gallbladder.  History of esophageal stricture that was dilated in 2018.  She did have evidence of esophagitis at that time.  At that time she was experiencing some dysphagia and lower chest discomfort  Since her symptoms started she has been taking Tums prophylactically.  At one point she did feel little bit of reflux but in general no reflux.  Has been eating less and has been careful about what she is eating  Medications and allergies reviewed with patient and updated if appropriate.  Current Outpatient Medications on File Prior to Visit  Medication Sig Dispense Refill   Biotin 1000 MCG tablet Take 1,000 mcg by mouth daily.     Calcium Carb-Cholecalciferol (CALCIUM 500+D3 PO) Take by mouth.     Cholecalciferol (VITAMIN D PO) Take by mouth.     fluticasone (FLONASE) 50 MCG/ACT nasal spray Place 2 sprays into both nostrils daily. (Patient taking differently: Place 2 sprays into both nostrils daily as needed.) 16 g 6   Multiple Vitamin (MULTIVITAMIN) tablet Take 1 tablet by mouth daily.     No current facility-administered medications on file prior to visit.    Review of Systems  Constitutional:  Negative for fever.  HENT:  Negative for sore throat and trouble swallowing.   Respiratory:  Negative for cough, shortness of breath and wheezing.   Cardiovascular:  Negative for chest pain  and palpitations.  Gastrointestinal:  Positive for abdominal pain. Negative for blood in stool (no melena), constipation, diarrhea and nausea.       No gerd       Objective:   Vitals:   09/29/22 1504  BP: 118/78  Pulse: 62  Temp: 98.1 F (36.7 C)  SpO2: 98%   BP Readings from Last 3 Encounters:  09/29/22 118/78  04/03/22 140/90  04/01/22 128/76   Wt Readings from Last 3 Encounters:  09/29/22 151 lb (68.5 kg)  08/11/22 152 lb (68.9 kg)  04/03/22 154 lb 15.7 oz (70.3 kg)   Body mass index is 23.65 kg/m.    Physical Exam Constitutional:      General: She is not in acute distress.    Appearance: Normal appearance. She is well-developed.  HENT:     Head: Normocephalic and atraumatic.  Eyes:     Conjunctiva/sclera: Conjunctivae normal.  Cardiovascular:     Rate and Rhythm: Normal rate and regular rhythm.     Heart sounds: Normal heart sounds. No murmur heard. Pulmonary:     Effort: Pulmonary effort is normal. No respiratory distress.     Breath sounds: Normal breath sounds. No wheezing.  Abdominal:     General: Abdomen is flat. There is no distension.     Palpations: Abdomen is soft.     Tenderness: There  is abdominal tenderness in the epigastric area. There is no guarding or rebound. Negative signs include Murphy's sign and McBurney's sign.     Hernia: No hernia is present.  Musculoskeletal:     Cervical back: Neck supple.     Right lower leg: No edema.     Left lower leg: No edema.  Lymphadenopathy:     Cervical: No cervical adenopathy.  Skin:    General: Skin is warm and dry.     Findings: No rash.  Neurological:     Mental Status: She is alert. Mental status is at baseline.  Psychiatric:        Mood and Affect: Mood normal.        Behavior: Behavior normal.            Assessment & Plan:    See Problem List for Assessment and Plan of chronic medical problems.

## 2022-10-26 NOTE — Progress Notes (Unsigned)
10/30/2022 Diane [name] [id number] [birth year]  Referring provider: Binnie Rail, MD Primary GI doctor: Dr. Havery Moros  ASSESSMENT AND PLAN:   Epigastric discomfort with GERD 2018 endoscopy with 2 cm hiatal hernia and esophageal stenosis status postdilatation In setting of recent Advil/NSAID use, resolved with Pepcid 40 mg once daily. No alarm symptoms. Discussed potentially barium swallow, endoscopy or conservative measures with the patient, at this time she would prefer to continue Pepcid just is not having any symptoms and avoid NSAIDs. Lifestyle changes discussed with patient. Follow-up 2 to 3 months, if any continuing issues we will plan for barium swallow versus EGD.  History of adenomatous polyp of colon 04/16/2020 colonoscopy Dr. Havery Moros for surveillance personal history of SS polyps 2018 good bowel preparation diminutive SS polyp cecum, diverticulosis.  Recall 7 years  Patient Care Team: Binnie Rail, MD as PCP - General (Internal Medicine) Vanessa Kick, MD as Consulting Physician (Obstetrics and Gynecology)  HISTORY OF PRESENT ILLNESS: 71 y.o. female with a past medical history of vitamin D deficiency, GERD and others listed below presents for evaluation of epigastric discomfort.   02/09/2017 endoscopy Dr. Havery Moros dysphagia and reflux 2 cm hernia, esophageal stenosis mild esophagitis dilated 19 mm, normal stomach normal duodenum.  Biopsy benign reflux 04/16/2020 colonoscopy Dr. Havery Moros for surveillance personal history of SS polyps 2018 good bowel preparation diminutive SS polyp cecum, diverticulosis.  Recall 7 years   Symptoms started beginning of Oct.  She has been having epigstric discomfort/substernal pressure radiating up. One episode of reflux at night into her throat only once.  Saw her PCP on 10/16 and started on pepcid 40 mg once a day that has helped her symptoms. Tomatoes and spicy food worse.  She states last year she has had episodes of food  getting caught and vomiting but very rare, has not happened in a year. Admits to eating too quickly.  She has had increase hiccups.  She denies melena, AB pain.  She denies nausea, vomiting.  She  reports AB bloating.  No unintentional weight loss, no night sweats. She reports NSAID use, had right shoulder surgery in April and root canal in May and took ibuprofen 1 tablet every 4 hours as needed, has been taking it more frequently.  She denies ETOH use.   No new medications.  She denies family history of gallbladder issues.    She  reports that she has never smoked. She has never used smokeless tobacco. She reports that she does not drink alcohol and does not use drugs.  Current Medications:     Current Outpatient Medications (Respiratory):    fluticasone (FLONASE) 50 MCG/ACT nasal spray, Place 2 sprays into both nostrils daily. (Patient taking differently: Place 2 sprays into both nostrils daily as needed.)    Current Outpatient Medications (Other):    Biotin 1000 MCG tablet, Take 1,000 mcg by mouth daily.   Calcium Carb-Cholecalciferol (CALCIUM 500+D3 PO), Take by mouth.   Cholecalciferol (VITAMIN D PO), Take by mouth.   famotidine (PEPCID) 40 MG tablet, Take 1 tablet (40 mg total) by mouth daily.   Multiple Vitamin (MULTIVITAMIN) tablet, Take 1 tablet by mouth daily.  Medical History:  Past Medical History:  Diagnosis Date   Allergy    Arthritis    Chicken pox    GERD (gastroesophageal reflux disease)    Knee pain    Seasonal allergic rhinitis    Vitamin D deficiency    Allergies: No Known Allergies   Surgical History:  She  has a past surgical history that includes Knee surgery (1995, 2005); wisdom teeth exraction; Colonoscopy (2009, 02/09/2017); Polypectomy; Upper gastrointestinal endoscopy; and Reverse shoulder arthroplasty (Right, 04/03/2022). Family History:  Her family history includes Aortic aneurysm in her father; Dementia in her father; Heart attack in her  mother; Heart disease in her father and mother; Stroke in her mother.  REVIEW OF SYSTEMS  : All other systems reviewed and negative except where noted in the History of Present Illness.  PHYSICAL EXAM: BP 130/66   Pulse 75   Ht '5\' 7"'$  (1.702 m)   Wt 155 lb 6 oz (70.5 kg)   BMI 24.34 kg/m  General:   Pleasant, well developed female in no acute distress Head:   Normocephalic and atraumatic. Eyes:  sclerae anicteric,conjunctive pink  Heart:   regular rate and rhythm Pulm:  Clear anteriorly; no wheezing Abdomen:   Soft, Obese AB, Active bowel sounds. No tenderness . , No organomegaly appreciated. Rectal: Not evaluated Extremities:  Without edema. Msk: Symmetrical without gross deformities. Peripheral pulses intact.  Neurologic:  Alert and  oriented x4;  No focal deficits.  Skin:   Dry and intact without significant lesions or rashes. Psychiatric:  Cooperative. Normal mood and affect.    Vladimir Crofts, PA-C 10:42 AM

## 2022-10-30 ENCOUNTER — Ambulatory Visit: Payer: Medicare PPO | Admitting: Physician Assistant

## 2022-10-30 ENCOUNTER — Encounter: Payer: Self-pay | Admitting: Physician Assistant

## 2022-10-30 VITALS — BP 130/66 | HR 75 | Ht 67.0 in | Wt 155.4 lb

## 2022-10-30 DIAGNOSIS — Z9889 Other specified postprocedural states: Secondary | ICD-10-CM | POA: Diagnosis not present

## 2022-10-30 DIAGNOSIS — Z86010 Personal history of colonic polyps: Secondary | ICD-10-CM

## 2022-10-30 DIAGNOSIS — R1013 Epigastric pain: Secondary | ICD-10-CM

## 2022-10-30 DIAGNOSIS — K219 Gastro-esophageal reflux disease without esophagitis: Secondary | ICD-10-CM

## 2022-10-30 NOTE — Progress Notes (Signed)
Agree with assessment and plan as outlined.  

## 2022-10-30 NOTE — Patient Instructions (Addendum)
No aleve, ibuprofen, goody powders, as these are antiinflammatories and can cause inflammation in your stomach, increase bleeding risk and cause ulcers.  You can talk with PCP about alternative pain options.  Can do tyelnol max 3000 mg a day, salon pas patches are over the counter and voltern gel is topical antiinflammatory that is safe.   Continue the pepcid 40 mg for at least another 30 days and as needed.  STOP the NSAIDS.  Avoid spicy and acidic foods Avoid fatty foods Limit your intake of coffee, tea, alcohol, and carbonated drinks Work to maintain a healthy weight Keep the head of the bed elevated at least 3 inches with blocks or a wedge pillow if you are having any nighttime symptoms Stay upright for 2 hours after eating Avoid meals and snacks three to four hours before bedtime  Dysphagia precautions:  1. Take reflux medications 30+ minutes before food in the morning 2. Begin meals with warm beverage 3. Eat smaller more frequent meals 4. Eat slowly, taking small bites and sips 5. Alternate solids and liquids 6. Avoid foods/liquids that increase acid production 7. Sit upright during and for 30+ minutes after meals to facilitate esophageal clearing 8. All meats should be chopped finely.   If something gets hung in your esophagus and will not come up or go down, proceed to the emergency room.     If still having symptoms will plan for barium swallow or endoscopy.   Diverticulosis Diverticulosis is a condition that develops when small pouches (diverticula) form in the wall of the large intestine (colon). The colon is where water is absorbed and stool (feces) is formed. The pouches form when the inside layer of the colon pushes through weak spots in the outer layers of the colon. You may have a few pouches or many of them. The pouches usually do not cause problems unless they become inflamed or infected. When this happens, the condition is called diverticulitis- this is left lower  quadrant pain, diarrhea, fever, chills, nausea or vomiting.  If this occurs please call the office or go to the hospital. Sometimes these patches without inflammation can also have painless bleeding associated with them, if this happens please call the office or go to the hospital. Preventing constipation and increasing fiber can help reduce diverticula and prevent complications. Even if you feel you have a high-fiber diet, suggest getting on Benefiber or Cirtracel 2 times daily.

## 2022-11-03 DIAGNOSIS — M25511 Pain in right shoulder: Secondary | ICD-10-CM | POA: Diagnosis not present

## 2023-03-17 ENCOUNTER — Ambulatory Visit (INDEPENDENT_AMBULATORY_CARE_PROVIDER_SITE_OTHER): Payer: Medicare PPO

## 2023-03-17 ENCOUNTER — Encounter: Payer: Self-pay | Admitting: Family Medicine

## 2023-03-17 ENCOUNTER — Ambulatory Visit: Payer: Medicare PPO | Admitting: Family Medicine

## 2023-03-17 VITALS — BP 124/94 | HR 86 | Ht 67.0 in | Wt 157.0 lb

## 2023-03-17 DIAGNOSIS — M25551 Pain in right hip: Secondary | ICD-10-CM | POA: Insufficient documentation

## 2023-03-17 NOTE — Progress Notes (Signed)
Diane Diane [name] Phone: [phone] Subjective:   Diane Diane [name], am serving as a scribe for Dr. Hulan Diane [name].  I'm seeing this patient by the request  of:  Diane Rail, MD  CC: hip pain   [id number]  Diane Diane [name] is a 72 y.o. female coming in with complaint of R hip pain in her groin. Pain for past 2 weeks. Painful to get into the car on the passenger side and going up stairs is painful. Pain can radiate into the glute.   Has been trying to walk for exercise and is having increase in pain but wonders if her hip pain is causing knee pain.      Past Medical History:  Diagnosis Date   Allergy    Arthritis    Chicken pox    GERD (gastroesophageal reflux disease)    Knee pain    Seasonal allergic rhinitis    Vitamin D deficiency    Past Surgical History:  Procedure Laterality Date   COLONOSCOPY  2009, 02/09/2017   [place], 2005   POLYPECTOMY     REVERSE SHOULDER ARTHROPLASTY Right 04/03/2022   Procedure: REVERSE SHOULDER ARTHROPLASTY;  Surgeon: Diane Ade, MD;  Location: [facility];  Service: Orthopedics;  Laterality: Right;   UPPER GASTROINTESTINAL ENDOSCOPY     wisdom teeth exraction     Social History   Socioeconomic History   Marital status: Married    Spouse name: Not on file   Number of children: Not on file   Years of education: Not on file   Highest education level: Not on file  Occupational History   Not on file  Tobacco Use   Smoking status: Never   Smokeless tobacco: Never  Vaping Use   Vaping Use: Never used  Substance and Sexual Activity   Alcohol use: Diane [name]   Drug use: Diane [name]   Sexual activity: Yes    Partners: Male  Other Topics Concern   Not on file  Social History Narrative   Not on file   Social Determinants of Health   Financial Resource Strain: Low Risk  (08/11/2022)   Overall Financial Resource Strain (CARDIA)    Difficulty of Paying Living Expenses: Not  hard at all  Food Insecurity: Diane [name] Food Insecurity (08/11/2022)   Hunger Vital Sign    Worried About Running Out of Food in the Last Year: Never true    [place] in the Last Year: Never true  Transportation Needs: Diane [name] Transportation Needs (08/11/2022)   PRAPARE - Hydrologist (Medical): Diane [name]    Lack of Transportation (Non-Medical): Diane [name]  Physical Activity: Insufficiently Active (08/11/2022)   Exercise Vital Sign    Days of Exercise per Week: 4 days    Minutes of Exercise per Session: 20 min  Stress: Diane [name] Stress Concern Present (08/11/2022)   [place]    Feeling of Stress : Not at all  Social Connections: [place] (08/11/2022)   Social Connection and Isolation Panel [NHANES]    Frequency of Communication with Friends and Family: More than three times a week    Frequency of Social Gatherings with Friends and Family: More than three times a week    Attends Religious Services: More than 4 times per year    Active Member of [employer] or Organizations: Yes    Attends Archivist Meetings: More than  4 times per year    Marital Status: Married   Diane [name] Known Allergies Family History  Problem Relation Age of Onset   Stroke Mother    Heart disease Mother    Heart attack Mother    Heart disease Father    Aortic aneurysm Father    Dementia Father    Colon cancer Neg Hx    Stomach cancer Neg Hx    Esophageal cancer Neg Hx    Pancreatic cancer Neg Hx    Rectal cancer Neg Hx    Colon polyps Neg Hx       Current Outpatient Medications (Respiratory):    fluticasone (FLONASE) 50 MCG/ACT nasal spray, Place 2 sprays into both nostrils daily. (Patient taking differently: Place 2 sprays into both nostrils daily as needed.)    Current Outpatient Medications (Other):    Biotin 1000 MCG tablet, Take 1,000 mcg by mouth daily.   Calcium Carb-Cholecalciferol (CALCIUM 500+D3 PO), Take by  mouth.   Cholecalciferol (VITAMIN D PO), Take by mouth.   famotidine (PEPCID) 40 MG tablet, Take 1 tablet (40 mg total) by mouth daily.   Multiple Vitamin (MULTIVITAMIN) tablet, Take 1 tablet by mouth daily.   Reviewed prior external information including notes and imaging from  primary care provider As well as notes that were available from care everywhere and other healthcare systems.  Past medical history, social, surgical and family history all reviewed in electronic medical record.  Diane [name] pertanent information unless stated regarding to the chief complaint.   Review of Systems:  Diane [name] headache, visual changes, nausea, vomiting, diarrhea, constipation, dizziness, abdominal pain, skin rash, fevers, chills, night sweats, weight loss, swollen lymph nodes, body aches, joint swelling, chest pain, shortness of breath, mood changes. POSITIVE muscle aches  Objective  Blood pressure (!) 124/94, pulse 86, height 5\' 7"  (1.702 m), weight 157 lb (71.2 kg), SpO2 94 %.   General: Diane [name] apparent distress alert and oriented x3 mood and affect normal, dressed appropriately.  HEENT: Pupils equal, extraocular movements intact  Respiratory: Patient's speak in full sentences and does not appear short of breath  Cardiovascular: Diane [name] lower extremity edema, non tender, Diane [name] erythema  Hip exam shows below.  Patient does have some decrease in internal range of motion.  Does have pain with resisted flexion of the knee.   [id number]; 15 additional minutes spent for Therapeutic exercises as stated in above notes.  This included exercises focusing on stretching, strengthening, with significant focus on eccentric aspects.   Long term goals include an improvement in range of motion, strength, endurance as well as avoiding reinjury. Patient's frequency would include in 1-2 times a day, 3-5 times a week for a duration of 6-12 weeks. Hip strengthening exercises which included:  Pelvic tilt/bracing to help with proper recruitment of the  lower abs and pelvic floor muscles  Glute strengthening to properly contract glutes without over-engaging low back and hamstrings - prone hip extension and glute bridge exercises Proper stretching techniques to increase effectiveness for the hip flexors, groin, quads, piriformic and low back when appropriate    Proper technique shown and discussed handout in great detail with ATC.  All questions were discussed and answered.     Impression and Recommendations:     The above documentation has been reviewed and is accurate and complete Diane Pulley, DO

## 2023-03-17 NOTE — Assessment & Plan Note (Signed)
Patient's x-rays do show some arthritic changes but does not seem severe enough of patient's pain.  Acute stress fracture could also be a concern.  Differential also includes potentially lumbar pathology or more likely an adductor strain.  Will treat more for muscle injuries at this time.  Discussed with his doing activities to avoid.  Increase activity slowly.  Follow-up again in 6 to 8 weeks

## 2023-03-17 NOTE — Patient Instructions (Addendum)
Thigh compression Exercises  See me again in 6 weeks

## 2023-04-21 NOTE — Progress Notes (Signed)
[name] [address] [place] [address] Phone: [phone] Subjective:   INadine Counts, am serving as a scribe for Dr. Antoine Primas.  I'm seeing this patient by the request  of:  Pincus Sanes, MD  CC: Hip pain follow-up  [id number]  03/17/2023 Patient's x-rays do show some arthritic changes but does not seem severe enough of patient's pain.  Acute stress fracture could also be a concern.  Differential also includes potentially lumbar pathology or more likely an adductor strain.  Will treat more for muscle injuries at this time.  Discussed with his doing activities to avoid.  Increase activity slowly.  Follow-up again in 6 to 8 weeks   Update 05/04/2023 Diane [name] is a 72 y.o. female coming in with complaint of R hip pain. Patient states hip pain is doing better, but not completely gone. Exercises have helped. Compression sleeve on thigh when active. No new concerns.      Past Medical History:  Diagnosis Date   Allergy    Arthritis    Chicken pox    GERD (gastroesophageal reflux disease)    Knee pain    Seasonal allergic rhinitis    Vitamin D deficiency    Past Surgical History:  Procedure Laterality Date   COLONOSCOPY  2009, 02/09/2017   KNEE SURGERY  1995, 2005   POLYPECTOMY     REVERSE SHOULDER ARTHROPLASTY Right 04/03/2022   Procedure: REVERSE SHOULDER ARTHROPLASTY;  Surgeon: Jones Broom, MD;  Location: [facility];  Service: Orthopedics;  Laterality: Right;   UPPER GASTROINTESTINAL ENDOSCOPY     wisdom teeth exraction     Social History   Socioeconomic History   Marital status: Married    Spouse name: Not on file   Number of children: Not on file   Years of education: Not on file   Highest education level: Not on file  Occupational History   Not on file  Tobacco Use   Smoking status: Never   Smokeless tobacco: Never  Vaping Use   Vaping Use: Never used  Substance and Sexual Activity   Alcohol use: No   Drug  use: No   Sexual activity: Yes    Partners: Male  Other Topics Concern   Not on file  Social History Narrative   Not on file   Social Determinants of Health   Financial Resource Strain: Low Risk  (08/11/2022)   Overall Financial Resource Strain (CARDIA)    Difficulty of Paying Living Expenses: Not hard at all  Food Insecurity: No Food Insecurity (08/11/2022)   Hunger Vital Sign    Worried About Running Out of Food in the Last Year: Never true    Ran Out of Food in the Last Year: Never true  Transportation Needs: No Transportation Needs (08/11/2022)   PRAPARE - Administrator, Civil Service (Medical): No    Lack of Transportation (Non-Medical): No  Physical Activity: Insufficiently Active (08/11/2022)   Exercise Vital Sign    Days of Exercise per Week: 4 days    Minutes of Exercise per Session: 20 min  Stress: No Stress Concern Present (08/11/2022)   [employer] of Occupational Health - Occupational Stress Questionnaire    Feeling of Stress : Not at all  Social Connections: Socially Integrated (08/11/2022)   Social Connection and Isolation Panel [NHANES]    Frequency of Communication with Friends and Family: More than three times a week    Frequency of Social Gatherings with  Friends and Family: More than three times a week    Attends Religious Services: More than 4 times per year    Active Member of Clubs or Organizations: Yes    Attends Engineer, structural: More than 4 times per year    Marital Status: Married   No Known Allergies Family History  Problem Relation Age of Onset   Stroke Mother    Heart disease Mother    Heart attack Mother    Heart disease Father    Aortic aneurysm Father    Dementia Father    Colon cancer Neg Hx    Stomach cancer Neg Hx    Esophageal cancer Neg Hx    Pancreatic cancer Neg Hx    Rectal cancer Neg Hx    Colon polyps Neg Hx       Current Outpatient Medications (Respiratory):    fluticasone (FLONASE) 50 MCG/ACT  nasal spray, Place 2 sprays into both nostrils daily. (Patient taking differently: Place 2 sprays into both nostrils daily as needed.)    Current Outpatient Medications (Other):    Biotin 1000 MCG tablet, Take 1,000 mcg by mouth daily.   Calcium Carb-Cholecalciferol (CALCIUM 500+D3 PO), Take by mouth.   Cholecalciferol (VITAMIN D PO), Take by mouth.   famotidine (PEPCID) 40 MG tablet, Take 1 tablet (40 mg total) by mouth daily.   Multiple Vitamin (MULTIVITAMIN) tablet, Take 1 tablet by mouth daily.    Objective  Blood pressure 124/88, pulse 76, height 5\' 7"  (1.702 m), weight 155 lb (70.3 kg), SpO2 94 %.   General: No apparent distress alert and oriented x3 mood and affect normal, dressed appropriately.  HEENT: Pupils equal, extraocular movements intact  Respiratory: Patient's speak in full sentences and does not appear short of breath  Cardiovascular: No lower extremity edema, non tender, no erythema  Right hip exam shows patient does have some mild tenderness with fulcrum test.  Significant improvement from previous exam.  Patient does have significant improvement in the strength of the hip flexor compared to previous exam on the right hip.    Impression and Recommendations:    The above documentation has been reviewed and is accurate and complete Judi Saa, DO

## 2023-05-04 ENCOUNTER — Encounter: Payer: Self-pay | Admitting: Family Medicine

## 2023-05-04 ENCOUNTER — Ambulatory Visit: Payer: Medicare PPO | Admitting: Family Medicine

## 2023-05-04 VITALS — BP 124/88 | HR 76 | Ht 67.0 in | Wt 155.0 lb

## 2023-05-04 DIAGNOSIS — M25551 Pain in right hip: Secondary | ICD-10-CM

## 2023-05-04 NOTE — Assessment & Plan Note (Signed)
Seems to be making improvement noted at this time.  Discussed which activities to do and which ones to avoid.  Increase activity slowly.  Discussed which activities to do and which ones to avoid.  Follow-up again in 6 to 8 weeks otherwise.

## 2023-06-12 DIAGNOSIS — Z1231 Encounter for screening mammogram for malignant neoplasm of breast: Secondary | ICD-10-CM | POA: Diagnosis not present

## 2023-06-12 DIAGNOSIS — Z01419 Encounter for gynecological examination (general) (routine) without abnormal findings: Secondary | ICD-10-CM | POA: Diagnosis not present

## 2023-06-12 LAB — HM MAMMOGRAPHY

## 2023-09-07 DIAGNOSIS — Z08 Encounter for follow-up examination after completed treatment for malignant neoplasm: Secondary | ICD-10-CM | POA: Diagnosis not present

## 2023-09-07 DIAGNOSIS — L570 Actinic keratosis: Secondary | ICD-10-CM | POA: Diagnosis not present

## 2023-09-07 DIAGNOSIS — L814 Other melanin hyperpigmentation: Secondary | ICD-10-CM | POA: Diagnosis not present

## 2023-09-07 DIAGNOSIS — Z85828 Personal history of other malignant neoplasm of skin: Secondary | ICD-10-CM | POA: Diagnosis not present

## 2023-09-07 DIAGNOSIS — D225 Melanocytic nevi of trunk: Secondary | ICD-10-CM | POA: Diagnosis not present

## 2023-09-07 DIAGNOSIS — L821 Other seborrheic keratosis: Secondary | ICD-10-CM | POA: Diagnosis not present

## 2023-09-11 ENCOUNTER — Ambulatory Visit (INDEPENDENT_AMBULATORY_CARE_PROVIDER_SITE_OTHER): Payer: Medicare PPO

## 2023-09-11 VITALS — Ht 67.0 in | Wt 151.0 lb

## 2023-09-11 DIAGNOSIS — Z0000 Encounter for general adult medical examination without abnormal findings: Secondary | ICD-10-CM

## 2023-09-11 NOTE — Progress Notes (Cosign Needed Addendum)
Subjective:   Diane [name] is a 72 y.o. female who presents for Medicare Annual (Subsequent) preventive examination.  Visit Complete: Virtual  I connected with  Diane [name] on 09/11/23 by a audio enabled telemedicine application and verified that I am speaking with the correct person using two identifiers.  Patient Location: Home  Provider Location: Home Office  I discussed the limitations of evaluation and management by telemedicine. The patient expressed understanding and agreed to proceed.  Because this visit was a virtual/telehealth visit, some criteria may be missing or patient reported. Any vitals not documented were not able to be obtained and vitals that have been documented are patient reported.    Cardiac Risk Factors include: advanced age (>63men, >69 women);dyslipidemia;Other (see comment), Risk factor comments: Osteopenia     Objective:    Today's Vitals   09/11/23 0943  Weight: 151 lb (68.5 kg)  Height: 5\' 7"  (1.702 m)   Body mass index is 23.65 kg/m.     09/11/2023    9:53 AM 08/11/2022   11:39 AM 03/28/2022    9:15 AM 08/09/2021    2:30 PM  Advanced Directives  Does Patient Have a Medical Advance Directive? No Yes No Yes  Type of Special educational needs teacher of [place];Living will  Living will  Does patient want to make changes to medical advance directive?    No - Patient declined  Copy of Healthcare Power of Attorney in Chart?  No - copy requested      Current Medications (verified) Outpatient Encounter Medications as of 09/11/2023  Medication Sig   Biotin 1000 MCG tablet Take 1,000 mcg by mouth daily.   Calcium Carb-Cholecalciferol (CALCIUM 500+D3 PO) Take by mouth.   Cholecalciferol (VITAMIN D PO) Take by mouth.   famotidine (PEPCID) 40 MG tablet Take 1 tablet (40 mg total) by mouth daily.   fluticasone (FLONASE) 50 MCG/ACT nasal spray Place 2 sprays into both nostrils daily. (Patient taking differently: Place 2 sprays into both nostrils  daily as needed.)   Multiple Vitamin (MULTIVITAMIN) tablet Take 1 tablet by mouth daily.   No facility-administered encounter medications on file as of 09/11/2023.    Allergies (verified) Patient has no known allergies.   History: Past Medical History:  Diagnosis Date   Allergy    Arthritis    Chicken pox    GERD (gastroesophageal reflux disease)    Knee pain    Seasonal allergic rhinitis    Vitamin D deficiency    Past Surgical History:  Procedure Laterality Date   COLONOSCOPY  2009, 02/09/2017   KNEE SURGERY  1995, 2005   POLYPECTOMY     REVERSE SHOULDER ARTHROPLASTY Right 04/03/2022   Procedure: REVERSE SHOULDER ARTHROPLASTY;  Surgeon: Jones Broom, MD;  Location: [facility];  Service: Orthopedics;  Laterality: Right;   UPPER GASTROINTESTINAL ENDOSCOPY     wisdom teeth exraction     Family History  Problem Relation Age of Onset   Stroke Mother    Heart disease Mother    Heart attack Mother    Heart disease Father    Aortic aneurysm Father    Dementia Father    Colon cancer Neg Hx    Stomach cancer Neg Hx    Esophageal cancer Neg Hx    Pancreatic cancer Neg Hx    Rectal cancer Neg Hx    Colon polyps Neg Hx    Social History   Socioeconomic History   Marital status: Married    Spouse name:  Diane [name]   Number of children: 3   Years of education: Not on file   Highest education level: Not on file  Occupational History   Occupation: Retired  Tobacco Use   Smoking status: Never   Smokeless tobacco: Never  Vaping Use   Vaping status: Never Used  Substance and Sexual Activity   Alcohol use: No   Drug use: No   Sexual activity: Yes    Partners: Male  Other Topics Concern   Not on file  Social History Narrative   Live with husband. Has 2 dogs   Social Determinants of Health   Financial Resource Strain: Low Risk  (09/11/2023)   Overall Financial Resource Strain (CARDIA)    Difficulty of Paying Living Expenses: Not very hard  Food Insecurity: No Food  Insecurity (09/11/2023)   Hunger Vital Sign    Worried About Running Out of Food in the Last Year: Never true    Ran Out of Food in the Last Year: Never true  Transportation Needs: No Transportation Needs (09/11/2023)   PRAPARE - Administrator, Civil Service (Medical): No    Lack of Transportation (Non-Medical): No  Physical Activity: Insufficiently Active (09/11/2023)   Exercise Vital Sign    Days of Exercise per Week: 3 days    Minutes of Exercise per Session: 10 min  Stress: No Stress Concern Present (09/11/2023)   [employer] of Occupational Health - Occupational Stress Questionnaire    Feeling of Stress : Not at all  Social Connections: Socially Integrated (09/11/2023)   Social Connection and Isolation Panel [NHANES]    Frequency of Communication with Friends and Family: More than three times a week    Frequency of Social Gatherings with Friends and Family: Once a week    Attends Religious Services: More than 4 times per year    Active Member of [employer] or Organizations: Yes    Attends Engineer, structural: More than 4 times per year    Marital Status: Married    Tobacco Counseling Counseling given: Not Answered   Clinical Intake:  Pre-visit preparation completed: Yes  Pain : No/denies pain     BMI - recorded: 23.65 Nutritional Status: BMI of 19-24  Normal Nutritional Risks: None  How often do you need to have someone help you when you read instructions, pamphlets, or other written materials from your doctor or pharmacy?: 1 - Never     Information entered by :: Yaden Seith, RMA   Activities of Daily Living    09/11/2023    9:49 AM  In your present state of health, do you have any difficulty performing the following activities:  Hearing? 0  Vision? 0  Difficulty concentrating or making decisions? 0  Walking or climbing stairs? 0  Dressing or bathing? 0  Doing errands, shopping? 0  Preparing Food and eating ? N  Using the Toilet? N   In the past six months, have you accidently leaked urine? Y  Do you have problems with loss of bowel control? N  Managing your Medications? N  Managing your Finances? N  Housekeeping or managing your Housekeeping? N    Patient Care Team: Pincus Sanes, MD as PCP - General (Internal Medicine) Waynard Reeds, MD as Consulting Physician (Obstetrics and Gynecology)  Indicate any recent Medical Services you may have received from other than [facility] providers in the past year (date may be approximate).     Assessment:   This is a routine wellness examination for  Diane [name].  Hearing/Vision screen Hearing Screening - Comments:: Denies hearing difficulties   Vision Screening - Comments:: Wear eyeglasses   Goals Addressed               This Visit's Progress     Patient Stated (pt-stated)        My goal is to keep my weight around 150 pounds or under.      Depression Screen    09/11/2023    9:59 AM 08/11/2022   11:47 AM 08/11/2022   11:37 AM 08/09/2021    2:35 PM 02/18/2021    9:32 AM 01/24/2019   11:21 AM  PHQ 2/9 Scores  PHQ - 2 Score 0 0 0 0 0 0  PHQ- 9 Score 0         Fall Risk    09/11/2023    9:54 AM 08/11/2022   11:34 AM 08/09/2021    2:31 PM  Fall Risk   Falls in the past year? 0 0 0  Number falls in past yr: 0 0 0  Injury with Fall? 0 0 0  Risk for fall due to : No Fall Risks  No Fall Risks  Follow up Falls evaluation completed;Falls prevention discussed Falls evaluation completed;Education provided Falls evaluation completed    MEDICARE RISK AT HOME: Medicare Risk at Home Any stairs in or around the home?: Yes If so, are there any without handrails?: Yes Home free of loose throw rugs in walkways, pet beds, electrical cords, etc?: Yes Adequate lighting in your home to reduce risk of falls?: Yes Life alert?: No Use of a cane, walker or w/c?: No Grab bars in the bathroom?: No Shower chair or bench in shower?: No Elevated toilet seat or a handicapped toilet?:  No  TIMED UP AND GO:  Was the test performed?  No    Cognitive Function:        09/11/2023    9:55 AM 08/11/2022   11:46 AM  6CIT Screen  What Year? 0 points 0 points  What month? 0 points 0 points  What time? 0 points 0 points  Count back from 20 0 points 0 points  Months in reverse 0 points 0 points  Repeat phrase 0 points 0 points  Total Score 0 points 0 points    Immunizations Immunization History  Administered Date(s) Administered   Fluad Quad(high Dose 65+) 09/22/2019, 09/07/2020, 09/18/2021   Influenza,inj,Quad PF,6+ Mos 10/08/2016, 09/14/2018, 09/22/2019   Influenza-Unspecified 10/09/2017, 08/30/2018, 09/24/2022   PFIZER Comirnaty(Gray Top)Covid-19 Tri-Sucrose Vaccine 06/06/2021   PFIZER(Purple Top)SARS-COV-2 Vaccination 01/03/2020, 01/24/2020, 09/29/2020   PNEUMOCOCCAL CONJUGATE-20 09/18/2021   Pneumococcal Conjugate-13 09/07/2020   Rsv, Bivalent, Protein Subunit Rsvpref,pf Verdis Frederickson) 09/24/2022   Tdap 07/15/2018   Zoster Recombinant(Shingrix) 08/13/2022, 10/24/2022   Zoster, Live 03/18/2014    TDAP status: Up to date  Flu Vaccine status: Due, Education has been provided regarding the importance of this vaccine. Advised may receive this vaccine at local pharmacy or Health Dept. Aware to provide a copy of the vaccination record if obtained from local pharmacy or Health Dept. Verbalized acceptance and understanding.  Pneumococcal vaccine status: Up to date  Covid-19 vaccine status: Information provided on how to obtain vaccines.   Qualifies for Shingles Vaccine? Yes   Zostavax completed Yes   Shingrix Completed?: Yes  Screening Tests Health Maintenance  Topic Date Due   MAMMOGRAM  12/29/2019   INFLUENZA VACCINE  07/16/2023   COVID-19 Vaccine (5 - 2023-24 season) 08/16/2023   DEXA SCAN  09/10/2024  Medicare Annual Wellness (AWV)  09/10/2024   Colonoscopy  04/17/2027   DTaP/Tdap/Td (2 - Td or Tdap) 07/15/2028   Pneumonia Vaccine 11+ Years old  Completed    Zoster Vaccines- Shingrix  Completed   HPV VACCINES  Aged Out   Hepatitis C Screening  Discontinued    Health Maintenance  Health Maintenance Due  Topic Date Due   MAMMOGRAM  12/29/2019   INFLUENZA VACCINE  07/16/2023   COVID-19 Vaccine (5 - 2023-24 season) 08/16/2023    Colorectal cancer screening: Type of screening: Colonoscopy. Completed 04/16/2020. Repeat every 7 years  Mammogram status: Completed 05/2023. Repeat every year  Bone Density status: Completed 09/10/2022. Results reflect: Bone density results: OSTEOPENIA. Repeat every 2 years.  Lung Cancer Screening: (Low Dose CT Chest recommended if Age 64-80 years, 20 pack-year currently smoking OR have quit w/in 15years.) does not qualify.   Lung Cancer Screening Referral: N/A  Additional Screening:  Hepatitis C Screening: does qualify; Completed 04/01/2022  Vision Screening: Recommended annual ophthalmology exams for early detection of glaucoma and other disorders of the eye. Is the patient up to date with their annual eye exam?  No  Who is the provider or what is the name of the office in which the patient attends annual eye exams? [facility] on [address]. If pt is not established with a provider, would they like to be referred to a provider to establish care? No .   Dental Screening: Recommended annual dental exams for proper oral hygiene   Community Resource Referral / Chronic Care Management: CRR required this visit?  No   CCM required this visit?  No     Plan:     I have personally reviewed and noted the following in the patient's chart:   Medical and social history Use of alcohol, tobacco or illicit drugs  Current medications and supplements including opioid prescriptions. Patient is not currently taking opioid prescriptions. Functional ability and status Nutritional status Physical activity Advanced directives List of other physicians Hospitalizations, surgeries, and ER visits in previous 12  months Vitals Screenings to include cognitive, depression, and falls Referrals and appointments  In addition, I have reviewed and discussed with patient certain preventive protocols, quality metrics, and best practice recommendations. A written personalized care plan for preventive services as well as general preventive health recommendations were provided to patient.     Harrold Fitchett L Antonina Deziel, CMA   09/11/2023   After Visit Summary: (MyChart) Due to this being a telephonic visit, the after visit summary with patients personalized plan was offered to patient via MyChart   Nurse Notes: Patient is due for a Flu vaccine.  She stated that she is thinking about the Covid vaccine.  Patient stated that she has had her mammogram for this year in June.  She is also due for a yearly eye exam, as she is in the process of finding a new eye doctor, she will update [place] when she does.  Patient would like to wait to schedule for her next year's AWVs.  She had no other concerns to address today.

## 2023-09-11 NOTE — Patient Instructions (Signed)
Diane [name] , Thank you for taking time to come for your Medicare Wellness Visit. I appreciate your ongoing commitment to your health goals. Please review the following plan we discussed and let me know if I can assist you in the future.   Referrals/Orders/Follow-Ups/Clinician Recommendations: You are due for a Flu vaccine.  I did find the injection for Pneumonia vaccine.  Please let us know who you chose to have as an eye doctor so we can add them to your care team in chart.  It was nice to talk with you today.  Each day, aim for 6 glasses of water, plenty of protein in your diet and try to get up and walk/ stretch every hour for 5-10 minutes at a time.    This is a list of the screening recommended for you and due dates:  Health Maintenance  Topic Date Due   Mammogram  12/29/2019   Pneumonia Vaccine (2 of 2 - PPSV23 or PCV20) 09/07/2021   Flu Shot  07/16/2023   COVID-19 Vaccine (5 - 2023-24 season) 08/16/2023   DEXA scan (bone density measurement)  09/10/2024   Medicare Annual Wellness Visit  09/10/2024   Colon Cancer Screening  04/17/2027   DTaP/Tdap/Td vaccine (2 - Td or Tdap) 07/15/2028   Zoster (Shingles) Vaccine  Completed   HPV Vaccine  Aged Out   Hepatitis C Screening  Discontinued    Advanced directives: (Copy Requested) Please bring a copy of your health care power of attorney and living will to the office to be added to your chart at your convenience.  Next Medicare Annual Wellness Visit scheduled for next year: No

## 2023-09-20 IMAGING — CT CT SHOULDER*R* W/O CM
1 of 2 series · 9 of 14 positions shown, 12 images · non-contrast
Comparison: Shoulder 02/27/2022

CLINICAL DATA: Chronic right shoulder pain.  Preoperative planning.

EXAM:
CT OF THE UPPER RIGHT EXTREMITY WITHOUT CONTRAST
TECHNIQUE: Multidetector CT imaging of the upper right extremity was performed
according to the standard protocol.
RADIATION DOSE REDUCTION: This exam was performed according to the
departmental dose-optimization program which includes automated
exposure control, adjustment of the mA and/or kV according to
patient size and/or use of iterative reconstruction technique.

[Series 5: thin soft · axial · 0.58mm/px · z∈[+689,+841]mm · 9 of 318 slices shown, 12 images]
[im 32/318  soft-tissue]
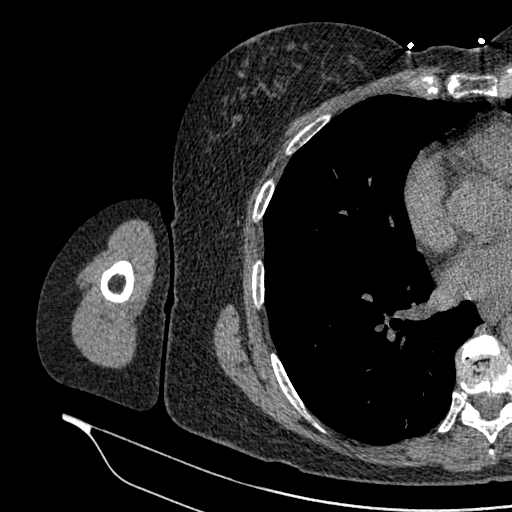
[im 32/318  bone]
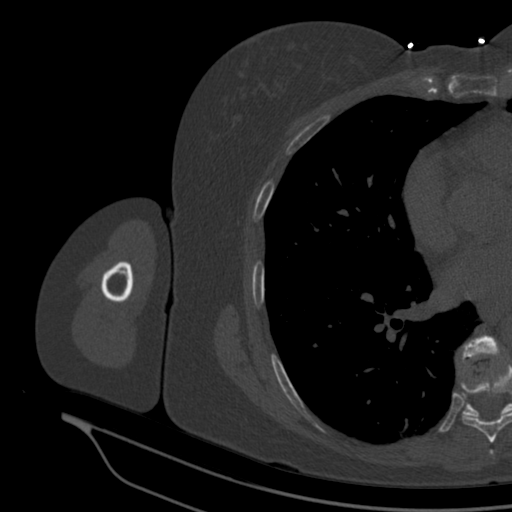
[im 64/318  bone]
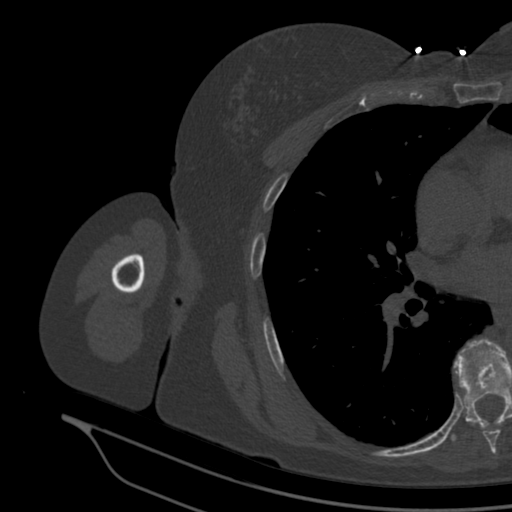
[im 96/318  bone]
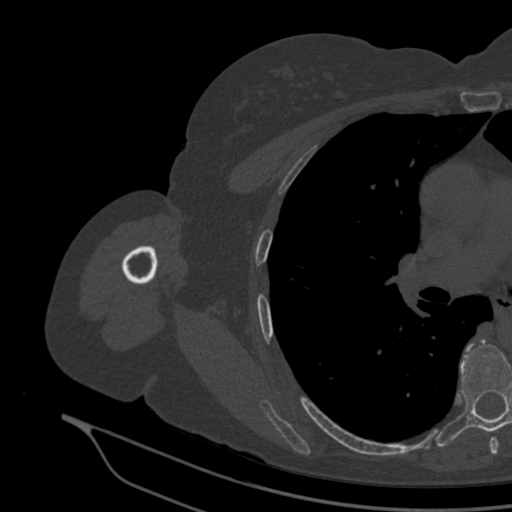
[im 127/318  bone]
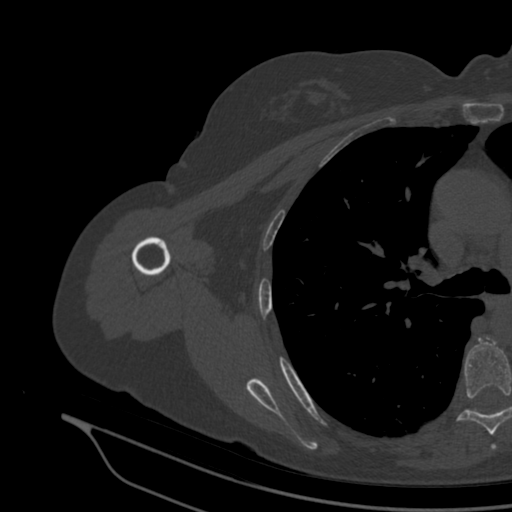
[im 159/318  soft-tissue]
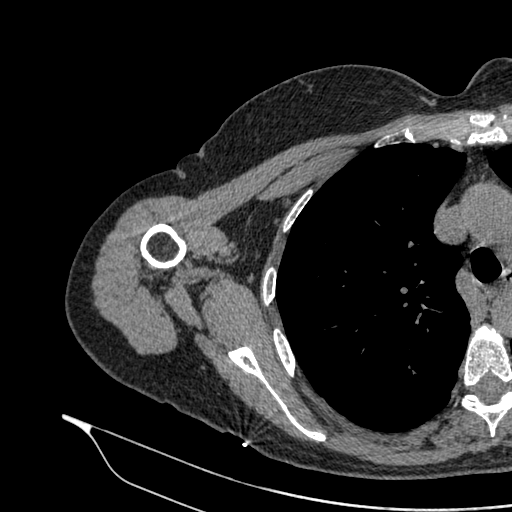
[im 159/318  bone]
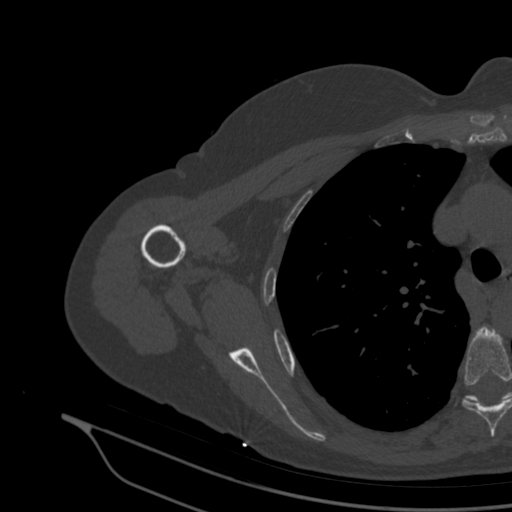
[im 191/318  bone]
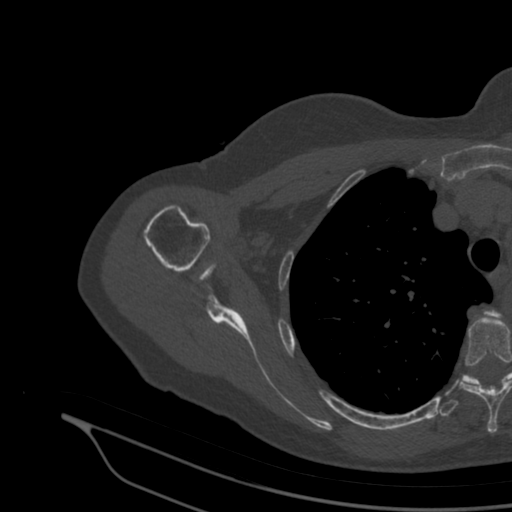
[im 222/318  bone]
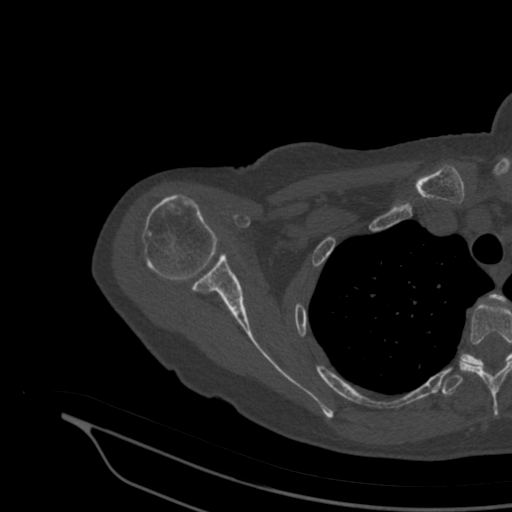
[im 254/318  bone]
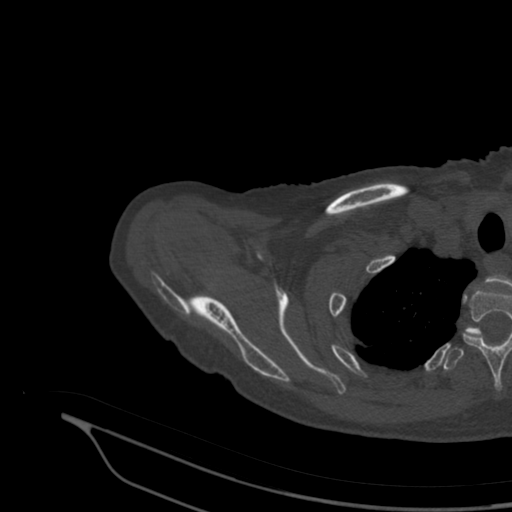
[im 286/318  soft-tissue]
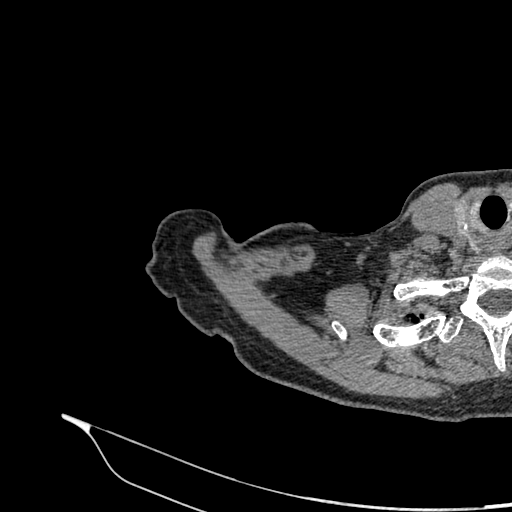
[im 286/318  bone]
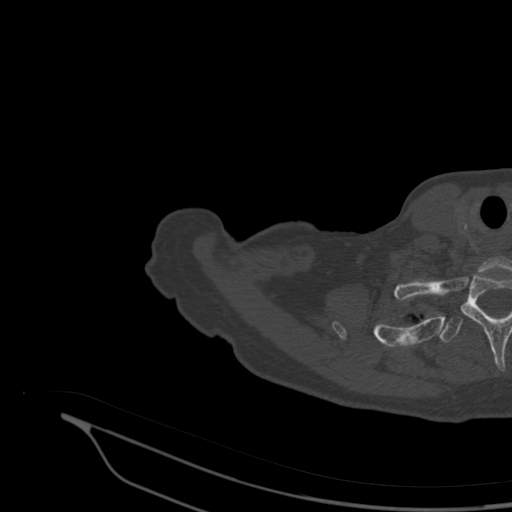

[9 of 14 positions shown; findings below may reference images not displayed]

FINDINGS: Bones/Joint/Cartilage

No fracture or dislocation. Normal alignment. Small joint effusion.

Moderate osteoarthritis of the glenohumeral joint with joint space
narrowing, and marginal osteophytosis.

Mild arthropathy of the acromioclavicular joint.

Ligaments

Ligaments are suboptimally evaluated by CT.

Muscles and Tendons
Muscles are normal. No muscle atrophy. Mild expansion of the
supraspinatus tendon likely reflecting tendinosis which is better
evaluated the MRI of shoulder dated 02/27/2022.

Soft tissue
No fluid collection or hematoma. No soft tissue mass. Visualized
portions of the lung are clear.
IMPRESSION: 1. Moderate osteoarthritis of the glenohumeral joint.

## 2023-09-20 IMAGING — CR DG CHEST 2V
2 series · 2 of 2 positions shown · non-contrast
Comparison: None.

CLINICAL DATA: Preoperative chest x-ray for right shoulder
replacement.

EXAM:
CHEST - 2 VIEW

[w chest pa]
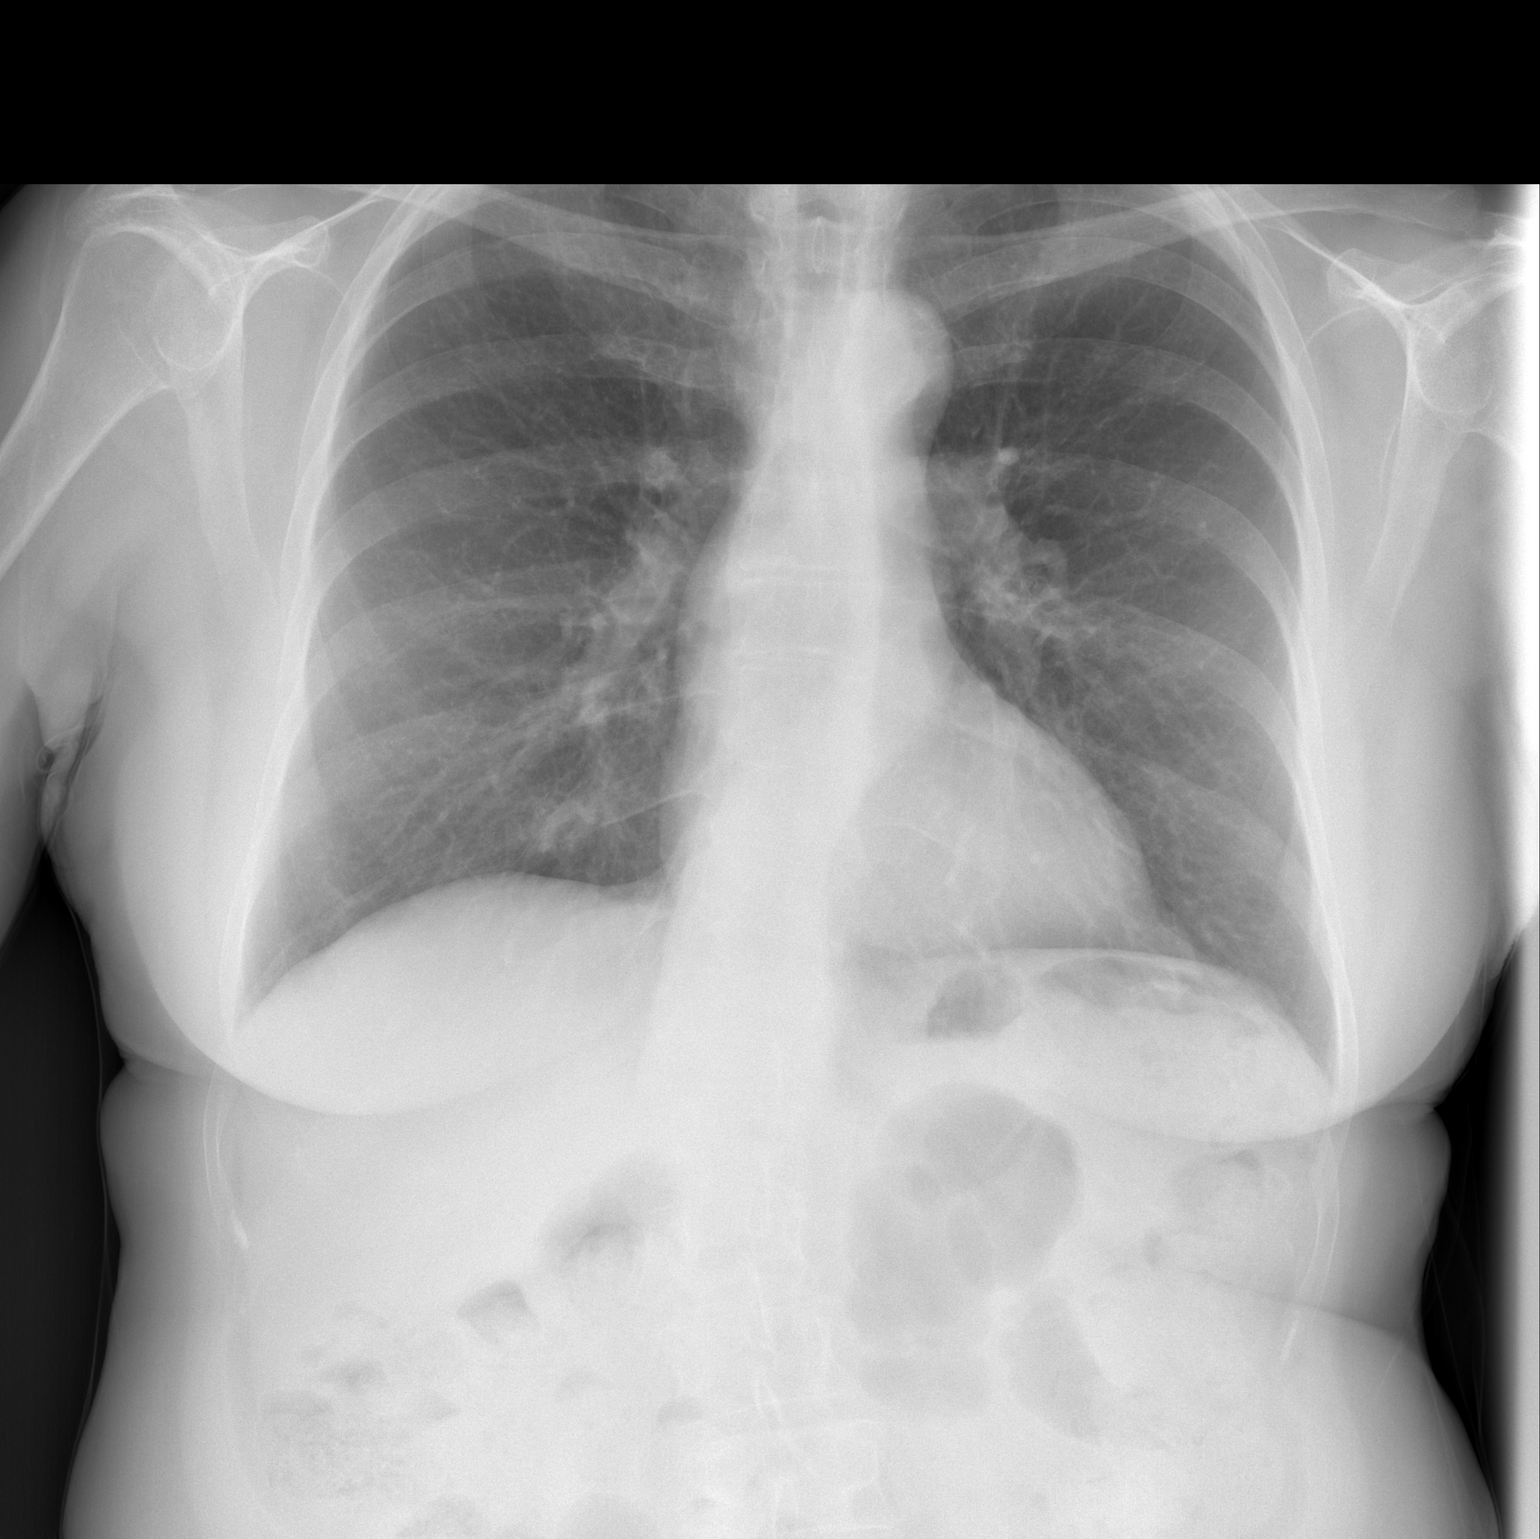

[w chest lat]
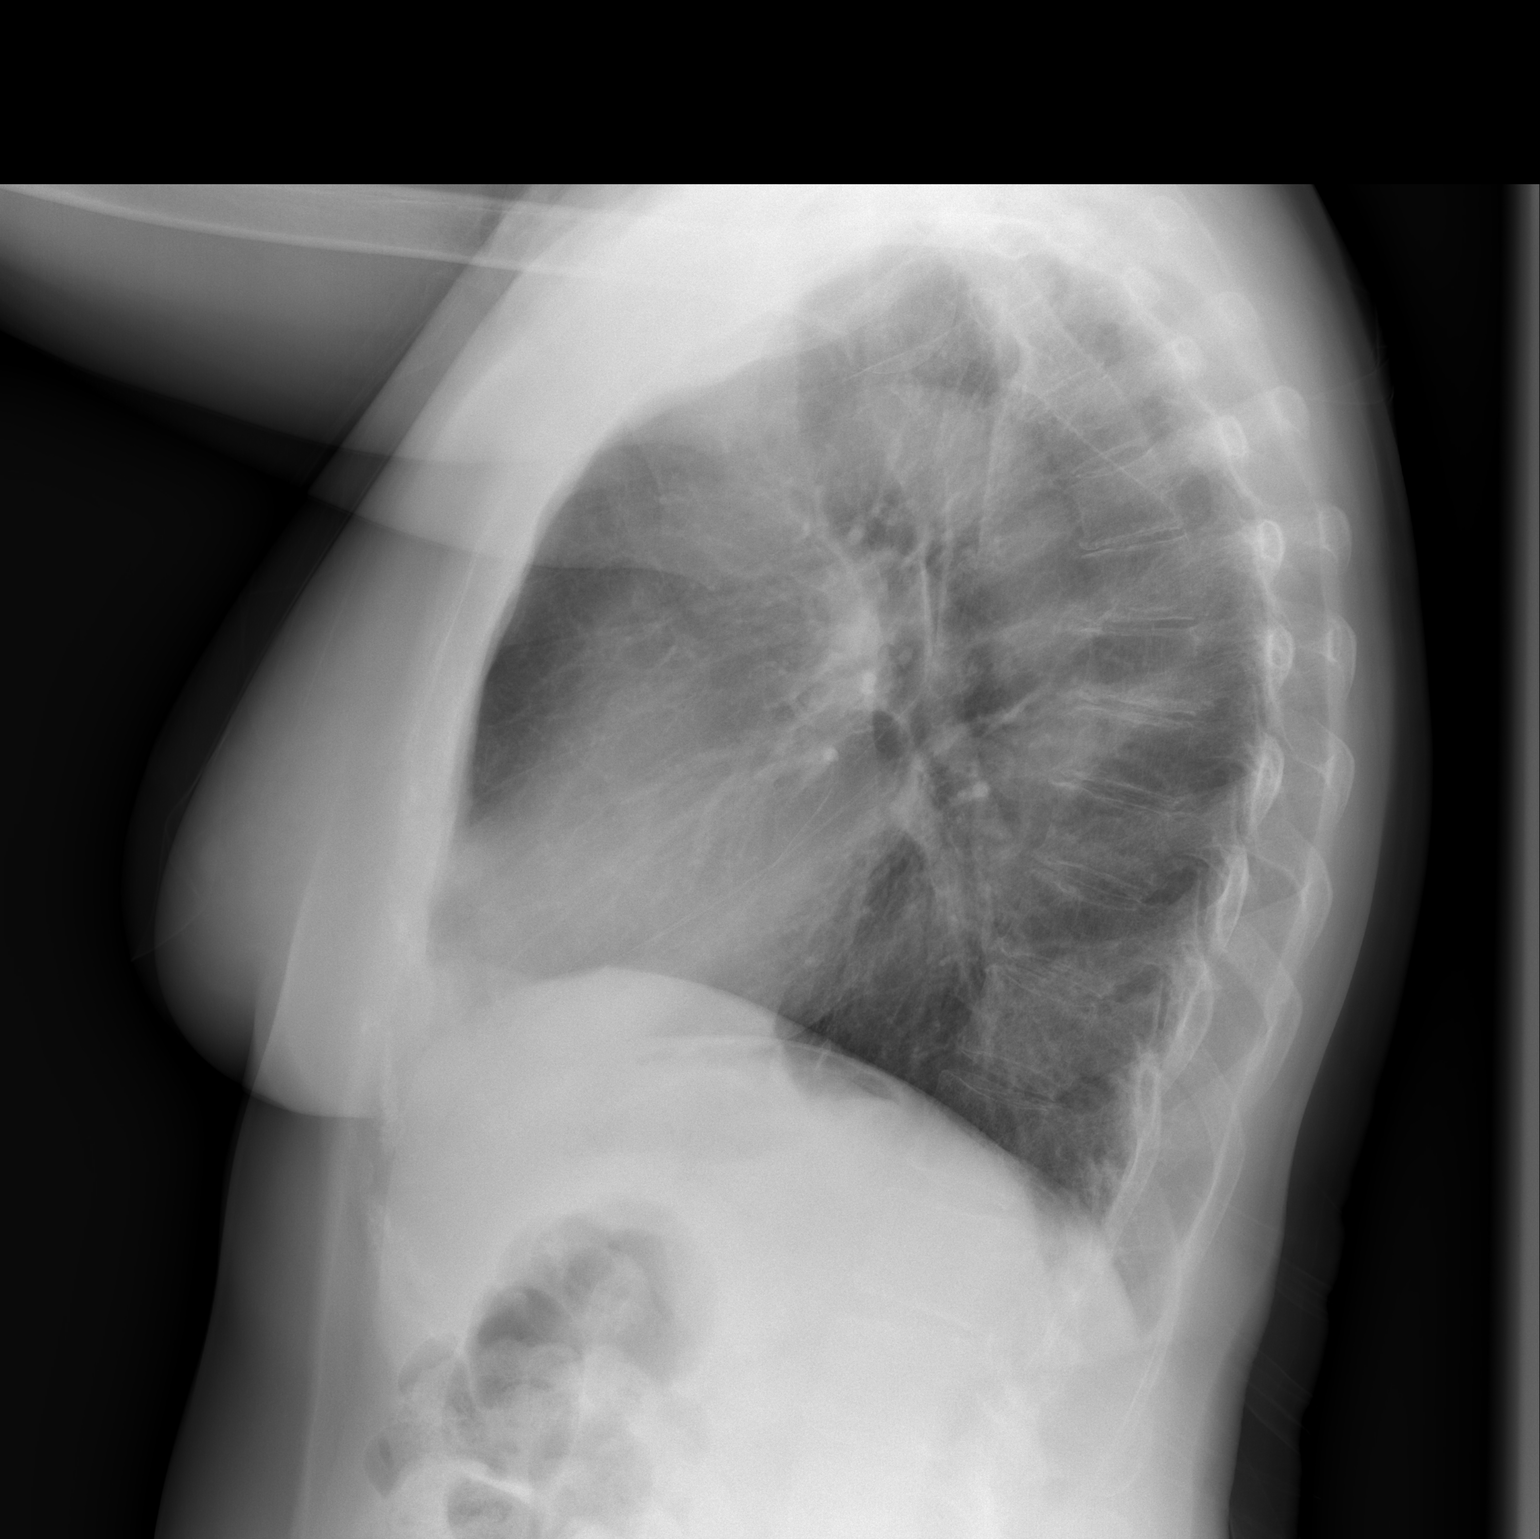

[2 of 2 positions shown; findings below may reference images not displayed]

FINDINGS: The heart size and mediastinal contours are within normal limits.
Both lungs are clear. Scoliosis of the spine noted.
IMPRESSION: No active cardiopulmonary disease.

## 2023-09-23 IMAGING — DX DG SHOULDER 1V*R*
1 series · 1 of 1 positions shown · non-contrast
Comparison: Right shoulder radiographs 10/02/2021

CLINICAL DATA: Status post right reverse total shoulder
arthroplasty.

EXAM:
RIGHT SHOULDER - 1 VIEW

[shoulder ap]
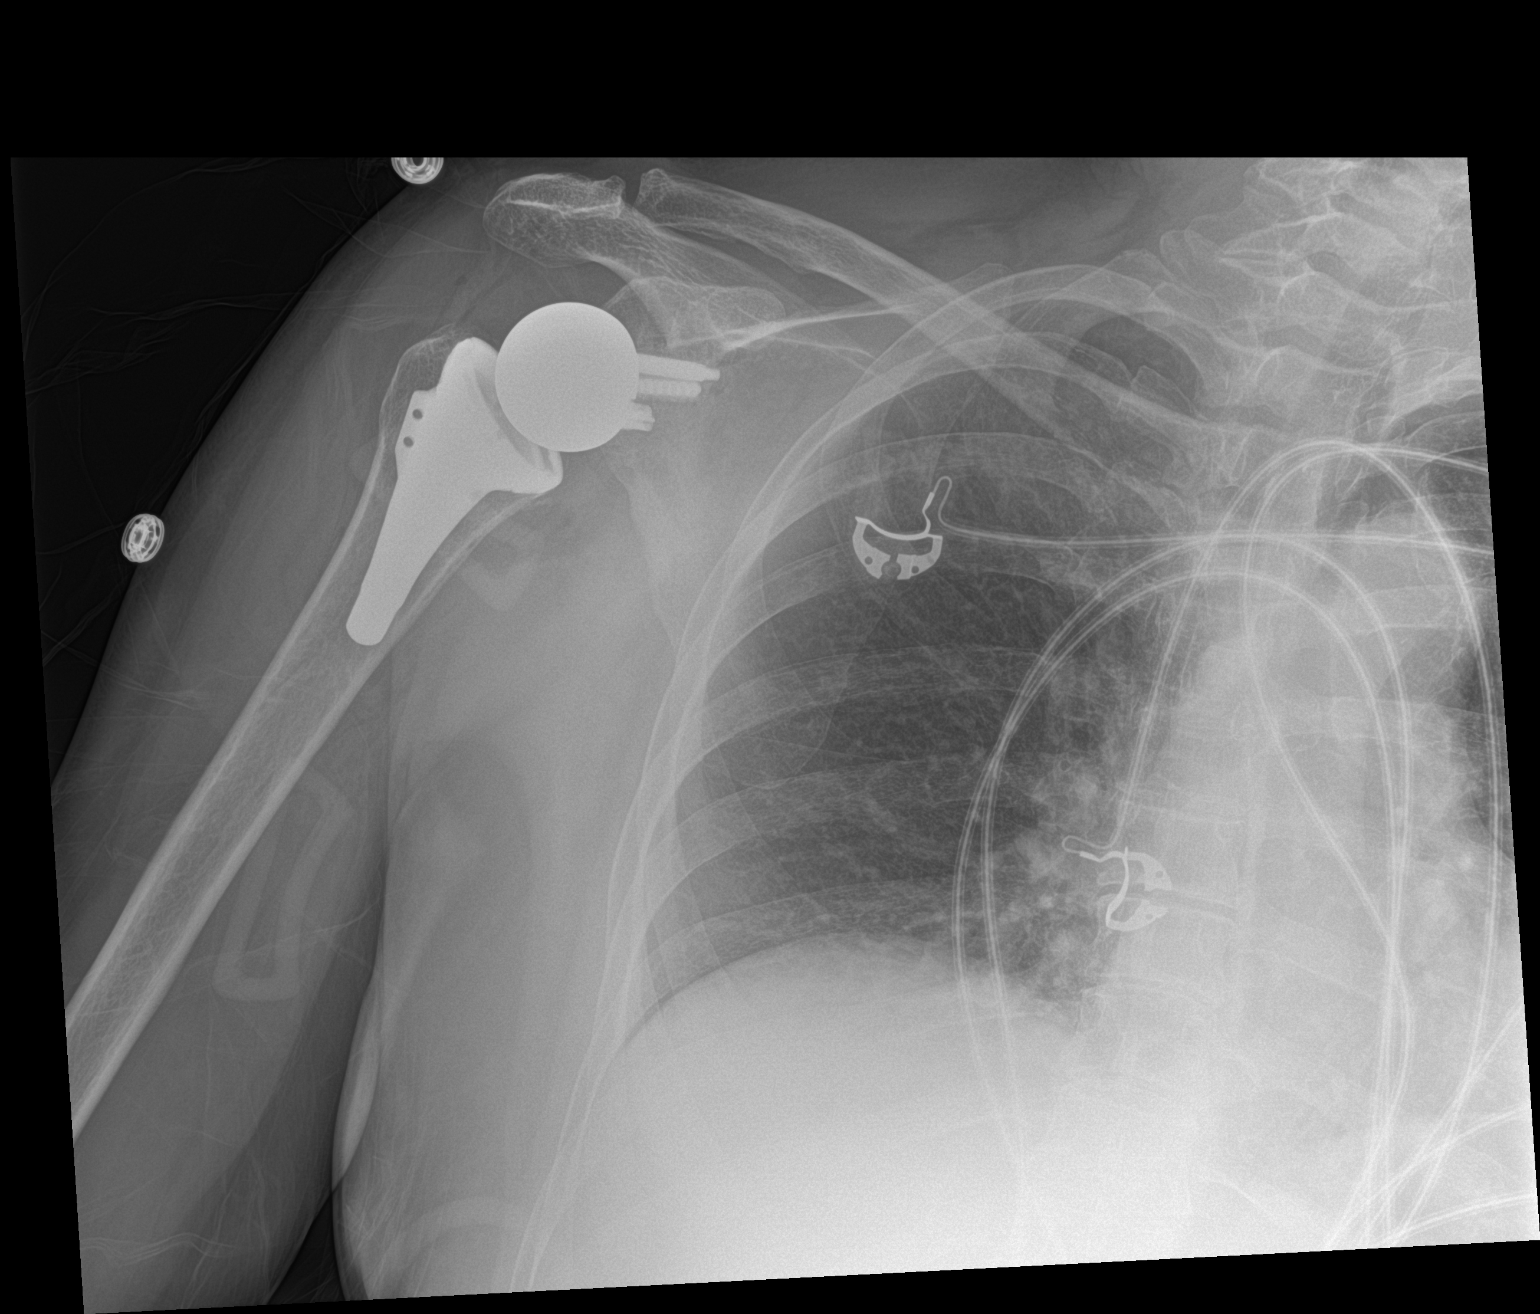

[1 of 1 positions shown; findings below may reference images not displayed]

FINDINGS: Interval reverse total right shoulder arthroplasty. No perihardware
lucency is seen to indicate hardware failure or loosening. Minimal
acromioclavicular peripheral degenerative osteophytes. No acute
fracture or dislocation. The visualized portion of the right lung is
unremarkable.
IMPRESSION: Interval reverse total right shoulder arthroplasty without evidence
of hardware failure on frontal view.

## 2024-05-22 ENCOUNTER — Encounter: Payer: Self-pay | Admitting: Internal Medicine

## 2024-05-22 NOTE — Progress Notes (Unsigned)
 Subjective:    Patient ID: Diane [name], female    DOB: 1951/12/15, 73 y.o.   MRN: [id number]      HPI Diane [name] is here for a Physical exam and her chronic medical problems.    No concerns.  Has lost some weight - did a healthy diet and is maintaining now.      Medications and allergies reviewed with patient and updated if appropriate.  Current Outpatient Medications on File Prior to Visit  Medication Sig Dispense Refill   Biotin 1000 MCG tablet Take 5,000 mcg by mouth daily.     Calcium Carb-Cholecalciferol (CALCIUM 500+D3 PO) Take by mouth.     Cholecalciferol (VITAMIN D  PO) Take by mouth.     fluticasone  (FLONASE ) 50 MCG/ACT nasal spray Place 2 sprays into both nostrils daily. (Patient taking differently: Place 2 sprays into both nostrils daily as needed.) 16 g 6   Multiple Vitamin (MULTIVITAMIN) tablet Take 1 tablet by mouth daily.     No current facility-administered medications on file prior to visit.    Review of Systems  Constitutional:  Negative for fever.  Eyes:  Negative for visual disturbance.  Respiratory:  Positive for cough (congestion in chest in morning - claritin helps). Negative for shortness of breath and wheezing.   Cardiovascular:  Negative for chest pain, palpitations and leg swelling.  Gastrointestinal:  Negative for abdominal pain, blood in stool, constipation and diarrhea.       No gerd  Genitourinary:  Negative for dysuria.  Musculoskeletal:  Negative for arthralgias and back pain.  Skin:  Negative for rash.  Neurological:  Negative for light-headedness and headaches.  Psychiatric/Behavioral:  Negative for dysphoric mood. The patient is not nervous/anxious.        Objective:   Vitals:   05/23/24 1000  BP: 130/76  Pulse: 77  Temp: 98.2 F (36.8 C)  SpO2: 99%   Filed Weights   05/23/24 1000  Weight: 146 lb (66.2 kg)   Body mass index is 22.87 kg/m.  BP Readings from Last 3 Encounters:  05/23/24 130/76  05/04/23 124/88   03/17/23 (!) 124/94    Wt Readings from Last 3 Encounters:  05/23/24 146 lb (66.2 kg)  09/11/23 151 lb (68.5 kg)  05/04/23 155 lb (70.3 kg)       Physical Exam Constitutional: She appears well-developed and well-nourished. No distress.  HENT:  Head: Normocephalic and atraumatic.  Right Ear: External ear normal. Normal ear canal and TM Left Ear: External ear normal.  Normal ear canal and TM Mouth/Throat: Oropharynx is clear and moist.  Eyes: Conjunctivae normal.  Neck: Neck supple. No tracheal deviation present. No thyromegaly present.  No carotid bruit  Cardiovascular: Normal rate, regular rhythm and normal heart sounds.   No murmur heard.  No edema. Pulmonary/Chest: Effort normal and breath sounds normal. No respiratory distress. She has no wheezes. She has no rales.  Breast: deferred   Abdominal: Soft. She exhibits no distension. There is no tenderness.  Lymphadenopathy: She has no cervical adenopathy.  Skin: Skin is warm and dry. She is not diaphoretic.  Psychiatric: She has a normal mood and affect. Her behavior is normal.     Lab Results  Component Value Date   WBC 7.0 03/31/2022   HGB 15.7 (H) 03/31/2022   HCT 47.2 (H) 03/31/2022   PLT 222 03/31/2022   GLUCOSE 80 04/01/2022   CHOL 234 (H) 04/01/2022   TRIG 170.0 (H) 04/01/2022   HDL 58.60 04/01/2022  LDLCALC 141 (H) 04/01/2022   ALT 16 04/01/2022   AST 22 04/01/2022   NA 140 04/01/2022   K 4.3 04/01/2022   CL 104 04/01/2022   CREATININE 0.71 04/01/2022   BUN 17 04/01/2022   CO2 28 04/01/2022   TSH 1.34 04/01/2022   INR 1.0 02/18/2021   HGBA1C 5.4 02/18/2021    The 10-year ASCVD risk score (Arnett DK, et al., 2019) is: 11.2%   Values used to calculate the score:     Age: 58 years     Sex: Female     Is Non-Hispanic African American: No     Diabetic: No     Tobacco smoker: No     Systolic Blood Pressure: 130 mmHg     Is BP treated: No     HDL Cholesterol: 58.6 mg/dL     Total Cholesterol: 234  mg/dL      Assessment & Plan:   Physical exam: Screening blood work  ordered Exercise  rowing Weight  normal Substance abuse  none   Reviewed recommended immunizations.   Health Maintenance  Topic Date Due   MAMMOGRAM  12/29/2019   COVID-19 Vaccine (5 - 2024-25 season) 06/07/2024 (Originally 08/16/2023)   INFLUENZA VACCINE  07/15/2024   DEXA SCAN  09/10/2024   Medicare Annual Wellness (AWV)  09/10/2024   Colonoscopy  04/17/2027   DTaP/Tdap/Td (2 - Td or Tdap) 07/15/2028   Pneumonia Vaccine 20+ Years old  Completed   Zoster Vaccines- Shingrix  Completed   HPV VACCINES  Aged Out   Meningococcal B Vaccine  Aged Out   Hepatitis C Screening  Discontinued      Mammogram up-to-date-will get report from gynecologist.    See Problem List for Assessment and Plan of chronic medical problems.

## 2024-05-22 NOTE — Patient Instructions (Addendum)

## 2024-05-23 ENCOUNTER — Ambulatory Visit (INDEPENDENT_AMBULATORY_CARE_PROVIDER_SITE_OTHER): Admitting: Internal Medicine

## 2024-05-23 VITALS — BP 130/76 | HR 77 | Temp 98.2°F | Ht 67.0 in | Wt 146.0 lb

## 2024-05-23 DIAGNOSIS — E7849 Other hyperlipidemia: Secondary | ICD-10-CM | POA: Diagnosis not present

## 2024-05-23 DIAGNOSIS — E2839 Other primary ovarian failure: Secondary | ICD-10-CM | POA: Diagnosis not present

## 2024-05-23 DIAGNOSIS — Z0000 Encounter for general adult medical examination without abnormal findings: Secondary | ICD-10-CM | POA: Diagnosis not present

## 2024-05-23 DIAGNOSIS — M85852 Other specified disorders of bone density and structure, left thigh: Secondary | ICD-10-CM | POA: Diagnosis not present

## 2024-05-23 LAB — COMPREHENSIVE METABOLIC PANEL WITH GFR
ALT: 13 U/L (ref 0–35)
AST: 20 U/L (ref 0–37)
Albumin: 4.4 g/dL (ref 3.5–5.2)
Alkaline Phosphatase: 71 U/L (ref 39–117)
BUN: 13 mg/dL (ref 6–23)
CO2: 27 meq/L (ref 19–32)
Calcium: 9.8 mg/dL (ref 8.4–10.5)
Chloride: 105 meq/L (ref 96–112)
Creatinine, Ser: 0.73 mg/dL (ref 0.40–1.20)
GFR: 82.61 mL/min (ref >60.00)
Glucose, Bld: 93 mg/dL (ref 70–99)
Potassium: 4 meq/L (ref 3.5–5.1)
Sodium: 139 meq/L (ref 135–145)
Total Bilirubin: 0.8 mg/dL (ref 0.2–1.2)
Total Protein: 7.3 g/dL (ref 6.0–8.3)

## 2024-05-23 LAB — CBC WITH DIFFERENTIAL/PLATELET
Basophils Absolute: 0 10*3/uL (ref 0.0–0.1)
Basophils Relative: 0.6 % (ref 0.0–3.0)
Eosinophils Absolute: 0.1 10*3/uL (ref 0.0–0.7)
Eosinophils Relative: 2.4 % (ref 0.0–5.0)
HCT: 44.1 % (ref 36.0–46.0)
Hemoglobin: 14.9 g/dL (ref 12.0–15.0)
Lymphocytes Relative: 23 % (ref 12.0–46.0)
Lymphs Abs: 1.4 10*3/uL (ref 0.7–4.0)
MCHC: 33.8 g/dL (ref 30.0–36.0)
MCV: 91.4 fl (ref 78.0–100.0)
Monocytes Absolute: 0.5 10*3/uL (ref 0.1–1.0)
Monocytes Relative: 9 % (ref 3.0–12.0)
Neutro Abs: 3.8 10*3/uL (ref 1.4–7.7)
Neutrophils Relative %: 65 % (ref 43.0–77.0)
Platelets: 214 10*3/uL (ref 150.0–400.0)
RBC: 4.82 Mil/uL (ref 3.87–5.11)
RDW: 13.5 % (ref 11.5–15.5)
WBC: 5.9 10*3/uL (ref 4.0–10.5)

## 2024-05-23 LAB — LIPID PANEL
Cholesterol: 253 mg/dL — ABNORMAL HIGH (ref 0–200)
HDL: 54.6 mg/dL (ref >39.00)
LDL Cholesterol: 177 mg/dL — ABNORMAL HIGH (ref 0–99)
NonHDL: 197.97
Total CHOL/HDL Ratio: 5
Triglycerides: 107 mg/dL (ref 0.0–149.0)
VLDL: 21.4 mg/dL (ref 0.0–40.0)

## 2024-05-23 MED ORDER — FAMOTIDINE 40 MG PO TABS: 40.0000 mg | ORAL_TABLET | Freq: Every day | ORAL | Status: AC | PRN

## 2024-05-23 NOTE — Assessment & Plan Note (Addendum)
 Chronic DEXA due this fall-ordered Continue calcium and vitamin D  Stressed regular exercise Check vitamin D  level

## 2024-05-23 NOTE — Assessment & Plan Note (Addendum)
 Chronic Regular exercise and healthy diet encouraged Check lipid panel, CMP, TSH Diet controlled ASCVD risk slightly elevated-discussed possible CT CAC-lipid panel may be better since she has lost weight

## 2024-05-26 ENCOUNTER — Ambulatory Visit: Payer: Self-pay | Admitting: Internal Medicine

## 2024-05-26 DIAGNOSIS — M85852 Other specified disorders of bone density and structure, left thigh: Secondary | ICD-10-CM

## 2024-05-26 LAB — TSH: TSH: 0.97 u[IU]/mL (ref 0.35–5.50)

## 2024-05-26 LAB — VITAMIN D 25 HYDROXY (VIT D DEFICIENCY, FRACTURES): VITD: 67.14 ng/mL (ref 30.00–100.00)

## 2024-06-03 ENCOUNTER — Telehealth: Payer: Self-pay | Admitting: Internal Medicine

## 2024-06-03 DIAGNOSIS — Z136 Encounter for screening for cardiovascular disorders: Secondary | ICD-10-CM

## 2024-06-03 NOTE — Telephone Encounter (Unsigned)
 Copied from CRM [id number]. Topic: Clinical - Medical Advice >> Jun 03, 2024  9:22 AM Allyne Areola wrote: Reason for CRM: Patient was seen on 05/26/2024 and discussed a possible CT scan with Dr.Burns. She would like to speak with Dr.Burns or her nurse to discuss it further.

## 2024-06-07 NOTE — Telephone Encounter (Signed)
 Message left for patient to return call to clinic.  If she wants to proceed with CT cardiac Dr. Geofm recommended please let me know so she can get it ordered.

## 2024-06-07 NOTE — Telephone Encounter (Unsigned)
 Copied from CRM [id number]. Topic: Clinical - Medical Advice >> Jun 03, 2024  9:22 AM Franky GRADE wrote: Reason for CRM: Patient was seen on 05/26/2024 and discussed a possible CT scan with Dr.Burns. She would like to speak with Dr.Burns or her nurse to discuss it further. >> Jun 07, 2024  4:52 PM Chiquita SQUIBB wrote: Patient is calling Tobias back to let her know she does want to do the CT cardiac scan.

## 2024-06-08 NOTE — Addendum Note (Signed)
 Addended by: GEOFM GLADE PARAS on: 06/08/2024 03:45 PM   Modules accepted: Orders

## 2024-06-08 NOTE — Telephone Encounter (Signed)
 Scan ordered

## 2024-06-20 DIAGNOSIS — Z1231 Encounter for screening mammogram for malignant neoplasm of breast: Secondary | ICD-10-CM | POA: Diagnosis not present

## 2024-06-20 LAB — HM MAMMOGRAPHY

## 2024-06-29 ENCOUNTER — Ambulatory Visit (HOSPITAL_BASED_OUTPATIENT_CLINIC_OR_DEPARTMENT_OTHER)
Admission: RE | Admit: 2024-06-29 | Discharge: 2024-06-29 | Disposition: A | Discharge status: Home / Self Care | Payer: Self-pay | Source: Ambulatory Visit | Attending: Internal Medicine | Admitting: Internal Medicine

## 2024-06-29 ENCOUNTER — Ambulatory Visit: Payer: Self-pay | Admitting: Internal Medicine

## 2024-06-29 DIAGNOSIS — Z136 Encounter for screening for cardiovascular disorders: Secondary | ICD-10-CM | POA: Insufficient documentation

## 2024-09-07 DIAGNOSIS — L821 Other seborrheic keratosis: Secondary | ICD-10-CM | POA: Diagnosis not present

## 2024-09-07 DIAGNOSIS — L814 Other melanin hyperpigmentation: Secondary | ICD-10-CM | POA: Diagnosis not present

## 2024-09-07 DIAGNOSIS — Z85828 Personal history of other malignant neoplasm of skin: Secondary | ICD-10-CM | POA: Diagnosis not present

## 2024-09-07 DIAGNOSIS — Z08 Encounter for follow-up examination after completed treatment for malignant neoplasm: Secondary | ICD-10-CM | POA: Diagnosis not present

## 2024-09-12 ENCOUNTER — Encounter: Admitting: Internal Medicine

## 2024-09-12 ENCOUNTER — Ambulatory Visit (INDEPENDENT_AMBULATORY_CARE_PROVIDER_SITE_OTHER)

## 2024-09-12 VITALS — Ht 67.0 in | Wt 142.0 lb

## 2024-09-12 DIAGNOSIS — Z0000 Encounter for general adult medical examination without abnormal findings: Secondary | ICD-10-CM | POA: Diagnosis not present

## 2024-09-12 NOTE — Patient Instructions (Addendum)
 [name],  Thank you for taking the time for your Medicare Wellness Visit. I appreciate your continued commitment to your health goals. Please review the care plan we discussed, and feel free to reach out if I can assist you further.  Medicare recommends these wellness visits once per year to help you and your care team stay ahead of potential health issues. These visits are designed to focus on prevention, allowing your provider to concentrate on managing your acute and chronic conditions during your regular appointments.  Please note that Annual Wellness Visits do not include a physical exam. Some assessments may be limited, especially if the visit was conducted virtually. If needed, we may recommend a separate in-person follow-up with your provider.  Ongoing Care Seeing your primary care provider every 3 to 6 months helps us  monitor your health and provide consistent, personalized care.   Referrals If a referral was made during today's visit and you haven't received any updates within two weeks, please contact the referred provider directly to check on the status.  Recommended Screenings:  Health Maintenance  Topic Date Due   Flu Shot  07/15/2024   COVID-19 Vaccine (5 - 2025-26 season) 08/15/2024   DEXA scan (bone density measurement)  09/10/2024   Breast Cancer Screening  05/16/2025   Medicare Annual Wellness Visit  09/12/2025   Colon Cancer Screening  04/17/2027   DTaP/Tdap/Td vaccine (2 - Td or Tdap) 07/15/2028   Pneumococcal Vaccine for age over 39  Completed   Zoster (Shingles) Vaccine  Completed   HPV Vaccine  Aged Out   Meningitis B Vaccine  Aged Out   Hepatitis C Screening  Discontinued       09/11/2023    9:53 AM  Advanced Directives  Does Patient Have a Medical Advance Directive? No   Advance Care Planning is important because it: Ensures you receive medical care that aligns with your values, goals, and preferences. Provides guidance to your family and loved ones,  reducing the emotional burden of decision-making during critical moments.  Vision: Annual vision screenings are recommended for early detection of glaucoma, cataracts, and diabetic retinopathy. These exams can also reveal signs of chronic conditions such as diabetes and high blood pressure.  Dental: Annual dental screenings help detect early signs of oral cancer, gum disease, and other conditions linked to overall health, including heart disease and diabetes.  Please see the attached documents for additional preventive care recommendations.

## 2024-09-12 NOTE — Progress Notes (Signed)
 Subjective:   Diane [name] is a 73 y.o. who presents for a Medicare Wellness preventive visit.  As a reminder, Annual Wellness Visits don't include a physical exam, and some assessments may be limited, especially if this visit is performed virtually. We may recommend an in-person follow-up visit with your provider if needed.  Visit Complete: Virtual I connected with  Diane [name] on 09/12/24 by a audio enabled telemedicine application and verified that I am speaking with the correct person using two identifiers.  Patient Location: Home  Provider Location: Office/Clinic  I discussed the limitations of evaluation and management by telemedicine. The patient expressed understanding and agreed to proceed.  Vital Signs: Because this visit was a virtual/telehealth visit, some criteria may be missing or patient reported. Any vitals not documented were not able to be obtained and vitals that have been documented are patient reported.  VideoDeclined- This patient declined Librarian, academic. Therefore the visit was completed with audio only.  Persons Participating in Visit: Patient.  AWV Questionnaire: No: Patient Medicare AWV questionnaire was not completed prior to this visit.  Cardiac Risk Factors include: advanced age (>29men, >53 women);dyslipidemia     Objective:    Today's Vitals   09/12/24 0954  Weight: 142 lb (64.4 kg)  Height: 5' 7 (1.702 m)   Body mass index is 22.24 kg/m.     09/11/2023    9:53 AM 08/11/2022   11:39 AM 03/28/2022    9:15 AM 08/09/2021    2:30 PM  Advanced Directives  Does Patient Have a Medical Advance Directive? No Yes No Yes  Type of Special educational needs teacher of [place];Living will  Living will  Does patient want to make changes to medical advance directive?    No - Patient declined  Copy of Healthcare Power of Attorney in Chart?  No - copy requested      Current Medications (verified) Outpatient  Encounter Medications as of 09/12/2024  Medication Sig   Biotin 1000 MCG tablet Take 5,000 mcg by mouth daily.   Calcium Carb-Cholecalciferol (CALCIUM 500+D3 PO) Take by mouth.   famotidine  (PEPCID ) 40 MG tablet Take 1 tablet (40 mg total) by mouth daily as needed for heartburn or indigestion.   fluticasone  (FLONASE ) 50 MCG/ACT nasal spray Place 2 sprays into both nostrils daily. (Patient taking differently: Place 2 sprays into both nostrils daily as needed.)   Cholecalciferol (VITAMIN D  PO) Take by mouth.   Multiple Vitamin (MULTIVITAMIN) tablet Take 1 tablet by mouth daily.   No facility-administered encounter medications on file as of 09/12/2024.    Allergies (verified) Patient has no known allergies.   History: Past Medical History:  Diagnosis Date   Allergy    Arthritis    Chicken pox    GERD (gastroesophageal reflux disease)    Knee pain    Seasonal allergic rhinitis    Vitamin D  deficiency    Past Surgical History:  Procedure Laterality Date   COLONOSCOPY  2009, 02/09/2017   KNEE SURGERY  1995, 2005   POLYPECTOMY     REVERSE SHOULDER ARTHROPLASTY Right 04/03/2022   Procedure: REVERSE SHOULDER ARTHROPLASTY;  Surgeon: Dozier Soulier, MD;  Location: [facility];  Service: Orthopedics;  Laterality: Right;   UPPER GASTROINTESTINAL ENDOSCOPY     wisdom teeth exraction     Family History  Problem Relation Age of Onset   Stroke Mother    Heart disease Mother    Heart attack Mother    Heart disease  Father    Aortic aneurysm Father    Dementia Father    Colon cancer Neg Hx    Stomach cancer Neg Hx    Esophageal cancer Neg Hx    Pancreatic cancer Neg Hx    Rectal cancer Neg Hx    Colon polyps Neg Hx    Social History   Socioeconomic History   Marital status: Married    Spouse name: Elsie   Number of children: 3   Years of education: Not on file   Highest education level: Not on file  Occupational History   Occupation: Retired  Tobacco Use   Smoking status: Never    Smokeless tobacco: Never  Vaping Use   Vaping status: Never Used  Substance and Sexual Activity   Alcohol use: No   Drug use: No   Sexual activity: Yes    Partners: Male  Other Topics Concern   Not on file  Social History Narrative   Live with husband. Has 2 dogs   Social Drivers of Corporate investment banker Strain: Low Risk  (09/12/2024)   Overall Financial Resource Strain (CARDIA)    Difficulty of Paying Living Expenses: Not hard at all  Food Insecurity: No Food Insecurity (09/12/2024)   Hunger Vital Sign    Worried About Running Out of Food in the Last Year: Never true    Ran Out of Food in the Last Year: Never true  Transportation Needs: No Transportation Needs (09/12/2024)   PRAPARE - Administrator, Civil Service (Medical): No    Lack of Transportation (Non-Medical): No  Physical Activity: Insufficiently Active (09/12/2024)   Exercise Vital Sign    Days of Exercise per Week: 3 days    Minutes of Exercise per Session: 30 min  Stress: No Stress Concern Present (09/12/2024)   [employer] of Occupational Health - Occupational Stress Questionnaire    Feeling of Stress: Not at all  Social Connections: Socially Integrated (09/12/2024)   Social Connection and Isolation Panel    Frequency of Communication with Friends and Family: More than three times a week    Frequency of Social Gatherings with Friends and Family: Once a week    Attends Religious Services: More than 4 times per year    Active Member of [employer] or Organizations: Yes    Attends Engineer, structural: More than 4 times per year    Marital Status: Married    Tobacco Counseling Counseling given: No    Clinical Intake:  Pre-visit preparation completed: Yes  Pain : No/denies pain     BMI - recorded: 22.24 Nutritional Status: BMI of 19-24  Normal Nutritional Risks: None Diabetes: No  Lab Results  Component Value Date   HGBA1C 5.4 02/18/2021     How often do you need to  have someone help you when you read instructions, pamphlets, or other written materials from your doctor or pharmacy?: 1 - Never  Interpreter Needed?: No  Information entered by :: Verdie Saba, CMA   Activities of Daily Living     09/12/2024    9:57 AM  In your present state of health, do you have any difficulty performing the following activities:  Hearing? 0  Vision? 0  Difficulty concentrating or making decisions? 0  Walking or climbing stairs? 0  Dressing or bathing? 0  Doing errands, shopping? 0  Preparing Food and eating ? N  Using the Toilet? N  In the past six months, have you accidently leaked urine?  Y  Do you have problems with loss of bowel control? N  Managing your Medications? N  Managing your Finances? N  Housekeeping or managing your Housekeeping? N    Patient Care Team: Geofm Glade PARAS, MD as PCP - General (Internal Medicine) Okey Leader, MD as Consulting Physician (Obstetrics and Gynecology)  I have updated your Care Teams any recent Medical Services you may have received from other providers in the past year.     Assessment:   This is a routine wellness examination for Laqueena.  Hearing/Vision screen Hearing Screening - Comments:: Denies hearing difficulties   Vision Screening - Comments:: Wears eyeglasses for reading - up to date with routine eye exams with Optometrist    Goals Addressed   None    Depression Screen     09/12/2024    9:57 AM 09/11/2023    9:59 AM 08/11/2022   11:47 AM 08/11/2022   11:37 AM 08/09/2021    2:35 PM 02/18/2021    9:32 AM 01/24/2019   11:21 AM  PHQ 2/9 Scores  PHQ - 2 Score 0 0 0 0 0 0 0  PHQ- 9 Score 0 0         Fall Risk     09/12/2024    9:57 AM 09/11/2023    9:54 AM 08/11/2022   11:34 AM 08/09/2021    2:31 PM  Fall Risk   Falls in the past year? 0 0 0 0  Number falls in past yr: 0 0 0 0  Injury with Fall? 0 0 0 0  Risk for fall due to : No Fall Risks No Fall Risks  No Fall Risks  Follow up Falls evaluation  completed;Falls prevention discussed Falls evaluation completed;Falls prevention discussed Falls evaluation completed;Education provided  Falls evaluation completed      Data saved with a previous flowsheet row definition    MEDICARE RISK AT HOME:  Medicare Risk at Home Any stairs in or around the home?: Yes If so, are there any without handrails?: No Home free of loose throw rugs in walkways, pet beds, electrical cords, etc?: Yes Adequate lighting in your home to reduce risk of falls?: Yes Life alert?: No Use of a cane, walker or w/c?: No Grab bars in the bathroom?: No Shower chair or bench in shower?: No Elevated toilet seat or a handicapped toilet?: Yes  TIMED UP AND GO:  Was the test performed?  No  Cognitive Function: 6CIT completed        09/12/2024   10:00 AM 09/11/2023    9:55 AM 08/11/2022   11:46 AM  6CIT Screen  What Year? 0 points 0 points 0 points  What month? 0 points 0 points 0 points  What time? 0 points 0 points 0 points  Count back from 20 0 points 0 points 0 points  Months in reverse 0 points 0 points 0 points  Repeat phrase 0 points 0 points 0 points  Total Score 0 points 0 points 0 points    Immunizations Immunization History  Administered Date(s) Administered    sv, Bivalent, Protein Subunit Rsvpref,pf [place]) 09/24/2022   Fluad Quad(high Dose 65+) 09/22/2019, 09/07/2020, 09/18/2021   Influenza,inj,Quad PF,6+ Mos 10/08/2016, 09/14/2018, 09/22/2019   Influenza-Unspecified 10/09/2017, 08/30/2018, 09/24/2022   PFIZER Comirnaty(Gray Top)Covid-19 Tri-Sucrose Vaccine 06/06/2021   PFIZER(Purple Top)SARS-COV-2 Vaccination 01/03/2020, 01/24/2020, 09/29/2020   PNEUMOCOCCAL CONJUGATE-20 09/18/2021   Pneumococcal Conjugate-13 09/07/2020   Tdap 07/15/2018   Zoster Recombinant(Shingrix) 08/13/2022, 10/24/2022   Zoster, Live 03/18/2014  Screening Tests Health Maintenance  Topic Date Due   Influenza Vaccine  07/15/2024   COVID-19 Vaccine (5 - 2025-26  season) 08/15/2024   DEXA SCAN  09/10/2024   Mammogram  05/16/2025   Medicare Annual Wellness (AWV)  09/12/2025   Colonoscopy  04/17/2027   DTaP/Tdap/Td (2 - Td or Tdap) 07/15/2028   Pneumococcal Vaccine: 50+ Years  Completed   Zoster Vaccines- Shingrix  Completed   HPV VACCINES  Aged Out   Meningococcal B Vaccine  Aged Out   Hepatitis C Screening  Discontinued    Health Maintenance Items Addressed: 09/12/2024  Mammogram status: Complete in 05/2024 by Gyn, Dr Marjorie Gull.  Report requested.  Additional Screening:  Vision Screening: Recommended annual ophthalmology exams for early detection of glaucoma and other disorders of the eye. Is the patient up to date with their annual eye exam?  Yes  Who is the provider or what is the name of the office in which the patient attends annual eye exams? Unable to recall name  Dental Screening: Recommended annual dental exams for proper oral hygiene  Community Resource Referral / Chronic Care Management: CRR required this visit?  No   CCM required this visit?  No   Plan:    I have personally reviewed and noted the following in the patient's chart:   Medical and social history Use of alcohol, tobacco or illicit drugs  Current medications and supplements including opioid prescriptions. Patient is not currently taking opioid prescriptions. Functional ability and status Nutritional status Physical activity Advanced directives List of other physicians Hospitalizations, surgeries, and ER visits in previous 12 months Vitals Screenings to include cognitive, depression, and falls Referrals and appointments  In addition, I have reviewed and discussed with patient certain preventive protocols, quality metrics, and best practice recommendations. A written personalized care plan for preventive services as well as general preventive health recommendations were provided to patient.   Verdie CHRISTELLA Saba, CMA   09/12/2024   After Visit Summary:  (MyChart) Due to this being a telephonic visit, the after visit summary with patients personalized plan was offered to patient via MyChart   Notes: Scheduled AWV & 1-yr Physical w/PCP for 09/2025.

## 2024-09-19 ENCOUNTER — Ambulatory Visit (INDEPENDENT_AMBULATORY_CARE_PROVIDER_SITE_OTHER)
Admission: RE | Admit: 2024-09-19 | Discharge: 2024-09-19 | Disposition: A | Discharge status: Home / Self Care | Source: Ambulatory Visit | Attending: Internal Medicine | Admitting: Internal Medicine

## 2024-09-19 DIAGNOSIS — M85852 Other specified disorders of bone density and structure, left thigh: Secondary | ICD-10-CM

## 2024-09-19 DIAGNOSIS — E2839 Other primary ovarian failure: Secondary | ICD-10-CM

## 2024-10-14 ENCOUNTER — Other Ambulatory Visit: Payer: Self-pay

## 2024-10-14 ENCOUNTER — Ambulatory Visit
Admission: RE | Admit: 2024-10-14 | Discharge: 2024-10-14 | Disposition: A | Discharge status: Home / Self Care | Attending: Physician Assistant | Admitting: Physician Assistant

## 2024-10-14 VITALS — BP 118/76 | HR 103 | Temp 98.1°F | Resp 16 | Ht 67.0 in | Wt 142.0 lb

## 2024-10-14 DIAGNOSIS — J029 Acute pharyngitis, unspecified: Secondary | ICD-10-CM

## 2024-10-14 DIAGNOSIS — U071 COVID-19: Secondary | ICD-10-CM | POA: Diagnosis not present

## 2024-10-14 LAB — POCT RAPID STREP A (OFFICE): Rapid Strep A Screen: NEGATIVE

## 2024-10-14 LAB — POC SOFIA SARS ANTIGEN FIA: SARS Coronavirus 2 Ag: POSITIVE — AB

## 2024-10-14 MED ORDER — LIDOCAINE VISCOUS HCL 2 % MT SOLN: 15.0000 mL | OROMUCOSAL | 0 refills | Status: AC | PRN

## 2024-10-14 NOTE — ED Provider Notes (Signed)
 [facility]    CSN: [id number] Arrival date & time: 10/14/24  0848      History   Chief Complaint Chief Complaint  Patient presents with   Sore Throat    Coughing - Entered by patient    HPI Diane [name] is a 73 y.o. female.   HPI  Pt presents today with concerns for sore throat  She states she initially started to feel sick on Monday with increasing symptoms on Tuesday and Wed. She thinks the sore throat became more noticeable yesterday AM and has progressed to the point where she is having severe pain with swallowing. She reports mild chills on Wed  She reports ear pain that started last night but this has largely resolved.  Interventions: Nyquil gel tablets but this bothered her stomach.  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 01/15/2022   AC joint pain 10/02/2021   Leg length discrepancy 05/15/2021   Hemarthrosis, right knee 11/26/2020   Lower back pain 12/19/2019   Hyperlipidemia 09/01/2019   History of esophageal dilatation 08/31/2019   Osteopenia 08/30/2019   Acute bursitis of left shoulder 02/01/2018   Acute bursitis of right shoulder 09/17/2017   Degenerative arthritis of knee, bilateral 12/01/2016    Past Surgical History:  Procedure Laterality Date   COLONOSCOPY  2009, 02/09/2017   KNEE SURGERY  1995, 2005   POLYPECTOMY     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      Home Medications    Prior to Admission medications   Medication Sig Start Date End Date Taking? Authorizing Provider  lidocaine  (XYLOCAINE ) 2 % solution Use as directed 15 mLs in the mouth or throat as needed. 10/14/24  Yes Leiam Hopwood E, PA-C  Biotin 1000 MCG tablet Take 5,000 mcg by mouth daily.    [provider]  Calcium Carb-Cholecalciferol (CALCIUM 500+D3 PO) Take by mouth.    [provider]  Cholecalciferol (VITAMIN D  PO) Take by mouth.    [provider]  famotidine  (PEPCID ) 40 MG tablet Take 1 tablet (40 mg total) by mouth daily as needed for heartburn or indigestion. 05/23/24   Geofm Glade PARAS, MD  fluticasone  (FLONASE ) 50 MCG/ACT nasal spray Place 2 sprays into both nostrils daily. Patient taking differently: Place 2 sprays into both nostrils daily as needed. 12/26/13   Gladis Elsie BROCKS, PA-C  Multiple Vitamin (MULTIVITAMIN) tablet Take 1 tablet by mouth daily.    [provider]    Family History Family History  Problem Relation Age of Onset   Stroke Mother    Heart disease Mother    Heart attack Mother    Heart disease Father    Aortic aneurysm Father    Dementia Father  Colon cancer Neg Hx    Stomach cancer Neg Hx    Esophageal cancer Neg Hx    Pancreatic cancer Neg Hx    Rectal cancer Neg Hx    Colon polyps Neg Hx     Social History Social History   Tobacco Use   Smoking status: Never   Smokeless tobacco: Never  Vaping Use   Vaping status: Never Used  Substance Use Topics   Alcohol use: No   Drug use: No     Allergies   Patient has no known allergies.   Review of Systems Review of Systems  Constitutional:  Positive for chills. Negative for fever.  HENT:  Positive for congestion, ear pain, postnasal drip, rhinorrhea and sore throat.   Respiratory:  Positive for cough. Negative for shortness of breath and wheezing.    Gastrointestinal:  Negative for diarrhea, nausea and vomiting.  Musculoskeletal:  Negative for myalgias.  Skin:  Negative for rash.  Neurological:  Negative for dizziness, light-headedness and headaches.     Physical Exam Triage Vital Signs ED Triage Vitals  Encounter Vitals Group     BP 10/14/24 0900 118/76     Girls Systolic BP Percentile --      Girls Diastolic BP Percentile --      Boys Systolic BP Percentile --      Boys Diastolic BP Percentile --      Pulse Rate 10/14/24 0900 (!) 103     Resp 10/14/24 0900 16     Temp 10/14/24 0900 98.1 F (36.7 C)     Temp Source 10/14/24 0900 Oral     SpO2 10/14/24 0900 98 %     Weight 10/14/24 0859 142 lb (64.4 kg)     Height 10/14/24 0859 5' 7 (1.702 m)     Head Circumference --      Peak Flow --      Pain Score 10/14/24 0859 9     Pain Loc --      Pain Education --      Exclude from Growth Chart --    No data found.  Updated Vital Signs BP 118/76 (BP Location: Right Arm)   Pulse (!) 103   Temp 98.1 F (36.7 C) (Oral)   Resp 16   Ht 5' 7 (1.702 m)   Wt 142 lb (64.4 kg)   SpO2 98%   BMI 22.24 kg/m   Visual Acuity Right Eye Distance:   Left Eye Distance:   Bilateral Distance:    Right Eye Near:   Left Eye Near:    Bilateral Near:     Physical Exam Vitals reviewed.  Constitutional:      General: She is awake.     Appearance: Normal appearance. She is well-developed and well-groomed.  HENT:     Head: Normocephalic and atraumatic.     Right Ear: Hearing, tympanic membrane and ear canal normal.     Left Ear: Hearing, tympanic membrane and ear canal normal.     Mouth/Throat:     Lips: Pink.     Mouth: Mucous membranes are moist.     Pharynx: Oropharynx is clear. Uvula midline. No pharyngeal swelling, oropharyngeal exudate, posterior oropharyngeal erythema, uvula swelling or postnasal drip.  Cardiovascular:     Rate and Rhythm: Normal rate and regular rhythm.     Pulses: Normal pulses.          Radial pulses  are 2+ on the right side and 2+ on the left side.  Heart sounds: Normal heart sounds. No murmur heard.    No friction rub. No gallop.  Pulmonary:     Effort: Pulmonary effort is normal.     Breath sounds: Normal breath sounds. No decreased air movement. No decreased breath sounds, wheezing, rhonchi or rales.  Musculoskeletal:     Cervical back: Normal range of motion and neck supple.  Lymphadenopathy:     Head:     Right side of head: No submental, submandibular or preauricular adenopathy.     Left side of head: No submental, submandibular or preauricular adenopathy.     Cervical:     Right cervical: No superficial cervical adenopathy.    Left cervical: No superficial cervical adenopathy.     Upper Body:     Right upper body: No supraclavicular adenopathy.     Left upper body: No supraclavicular adenopathy.  Neurological:     General: No focal deficit present.     Mental Status: She is alert and oriented to person, place, and time.  Psychiatric:        Mood and Affect: Mood normal.        Behavior: Behavior normal. Behavior is cooperative.      UC Treatments / Results  Labs (all labs ordered are listed, but only abnormal results are displayed) Labs Reviewed  POC SOFIA SARS ANTIGEN FIA - Abnormal; Notable for the following components:      Result Value   SARS Coronavirus 2 Ag Positive (*)    All other components within normal limits  POCT RAPID STREP A (OFFICE)    EKG   Radiology No results found.  Procedures Procedures (including critical care time)  Medications Ordered in UC Medications - No data to display  Initial Impression / Assessment and Plan / UC Course  I have reviewed the triage vital signs and the nursing notes.  Pertinent labs & imaging results that were available during my care of the patient were reviewed by me and considered in my medical decision making (see chart for details).      Final Clinical Impressions(s) / UC Diagnoses   Final  diagnoses:  Sore throat  COVID-19     Patient presents today with concerns for sore throat, nasal congestion, chills that been ongoing since Monday.  She reports that sore throat has gotten progressively worse since Wednesday.  She denies notable fever.  Physical exam is largely reassuring.  Testing in clinic today is positive for COVID, strep negative.  Patient is outside recommended therapeutic window for antiviral medications for COVID.  Recommend supportive measures with OTC medications.  Will send in viscous lidocaine  to assist with sore throat.  ED and return precautions reviewed and provided in AVS.  Follow-up as needed.   Discharge Instructions      You were seen today for concerns of sore throat, postnasal drainage and nasal congestion. Your testing was positive for COVID-19. You are currently on day 5 of symptoms so you are outside the recommended therapeutic window for antiviral medication such as Paxlovid.  To help manage your symptoms I recommend over-the-counter medications such as the following:  DayQuil/NyQuil Alka-Seltzer day and night TheraFlu day and night  *If you have High blood pressure I recommend taking Mucinex, Robitussin, and Acetaminophen  rather than the combination medications. Flonase  nasal spray  Nasal saline flushes/rinses Humidifier at night Warm tea with honey Ibuprofen as needed  Please make sure that you are getting plenty of rest and staying well-hydrated If you develop any of the following  symptoms please go to the emergency room: Significant difficulty breathing, chest pain, fevers that are not responding to acetaminophen  or ibuprofen, swelling of one of your arms or legs, inability to eat or drink leading to dehydration, confusion, loss of consciousness.      ED Prescriptions     Medication Sig Dispense Auth. Provider   lidocaine  (XYLOCAINE ) 2 % solution Use as directed 15 mLs in the mouth or throat as needed. 100 mL Cornelious Bartolucci E, PA-C       PDMP not reviewed this encounter.   Marylene Rocky BRAVO, PA-C 10/14/24 1135

## 2024-10-14 NOTE — ED Triage Notes (Signed)
 Pt presents with a chief complaint of sore throat x 4 days. This is accompanied with a cough. Currently rates overall throat pain a 9/10. Feels like it is very swollen and irritated. No fevers. OTC Nyquil + extra-strength Tylenol  taken with little improvement/relief. Denies sick contacts.

## 2024-10-14 NOTE — Discharge Instructions (Addendum)
 You were seen today for concerns of sore throat, postnasal drainage and nasal congestion. Your testing was positive for COVID-19. You are currently on day 5 of symptoms so you are outside the recommended therapeutic window for antiviral medication such as Paxlovid.  To help manage your symptoms I recommend over-the-counter medications such as the following:  DayQuil/NyQuil Alka-Seltzer day and night TheraFlu day and night  *If you have High blood pressure I recommend taking Mucinex, Robitussin, and Acetaminophen  rather than the combination medications. Flonase  nasal spray  Nasal saline flushes/rinses Humidifier at night Warm tea with honey Ibuprofen as needed  Please make sure that you are getting plenty of rest and staying well-hydrated If you develop any of the following symptoms please go to the emergency room: Significant difficulty breathing, chest pain, fevers that are not responding to acetaminophen  or ibuprofen, swelling of one of your arms or legs, inability to eat or drink leading to dehydration, confusion, loss of consciousness.

## 2025-09-18 ENCOUNTER — Ambulatory Visit

## 2025-09-18 ENCOUNTER — Encounter: Admitting: Internal Medicine
